# Patient Record
Sex: Male | Born: 1955
Health system: Southern US, Community
[De-identification: ages and names within clinical notes are randomized; demographics above are authoritative.]

## PROBLEM LIST (undated history)

## (undated) DIAGNOSIS — R7303 Prediabetes: Secondary | ICD-10-CM

## (undated) DIAGNOSIS — G4730 Sleep apnea, unspecified: Secondary | ICD-10-CM

## (undated) HISTORY — PX: TONSILLECTOMY: SUR1361

## (undated) HISTORY — DX: Prediabetes: R73.03

## (undated) HISTORY — DX: Sleep apnea, unspecified: G47.30

## (undated) HISTORY — PX: HERNIA REPAIR: SHX51

---

## 1999-05-12 ENCOUNTER — Ambulatory Visit (HOSPITAL_COMMUNITY): Admission: RE | Admit: 1999-05-12 | Discharge: 1999-05-12 | Payer: Self-pay | Admitting: Gastroenterology

## 2000-04-05 ENCOUNTER — Encounter: Payer: Self-pay | Admitting: Neurosurgery

## 2000-04-05 ENCOUNTER — Ambulatory Visit (HOSPITAL_COMMUNITY): Admission: RE | Admit: 2000-04-05 | Discharge: 2000-04-05 | Payer: Self-pay | Admitting: Neurosurgery

## 2000-04-26 ENCOUNTER — Ambulatory Visit (HOSPITAL_COMMUNITY): Admission: RE | Admit: 2000-04-26 | Discharge: 2000-04-26 | Payer: Self-pay | Admitting: Neurosurgery

## 2000-04-26 ENCOUNTER — Encounter: Payer: Self-pay | Admitting: Neurosurgery

## 2001-01-18 ENCOUNTER — Encounter: Payer: Self-pay | Admitting: Neurological Surgery

## 2001-01-18 ENCOUNTER — Observation Stay (HOSPITAL_COMMUNITY): Admission: RE | Admit: 2001-01-18 | Discharge: 2001-01-19 | Payer: Self-pay | Admitting: Neurological Surgery

## 2009-07-02 ENCOUNTER — Ambulatory Visit: Payer: Self-pay | Admitting: Family Medicine

## 2010-07-15 ENCOUNTER — Ambulatory Visit: Payer: Self-pay | Admitting: Internal Medicine

## 2010-07-24 ENCOUNTER — Ambulatory Visit: Payer: Self-pay | Admitting: Internal Medicine

## 2010-12-12 ENCOUNTER — Ambulatory Visit: Payer: Self-pay | Admitting: Internal Medicine

## 2014-05-25 DIAGNOSIS — M545 Low back pain, unspecified: Secondary | ICD-10-CM | POA: Insufficient documentation

## 2014-10-23 ENCOUNTER — Other Ambulatory Visit: Payer: Self-pay | Admitting: Gastroenterology

## 2014-10-23 LAB — HM COLONOSCOPY

## 2015-04-26 ENCOUNTER — Encounter: Payer: Self-pay | Admitting: Podiatry

## 2015-04-26 ENCOUNTER — Ambulatory Visit (INDEPENDENT_AMBULATORY_CARE_PROVIDER_SITE_OTHER): Payer: 59 | Admitting: Podiatry

## 2015-04-26 VITALS — BP 113/67 | HR 72 | Resp 16 | Ht 70.0 in | Wt 185.0 lb

## 2015-04-26 DIAGNOSIS — L600 Ingrowing nail: Secondary | ICD-10-CM

## 2015-04-26 MED ORDER — NEOMYCIN-POLYMYXIN-HC 3.5-10000-1 OT SOLN: OTIC | Status: DC

## 2015-04-26 NOTE — Progress Notes (Signed)
He presents today with chief complaint of a painful ingrown toenail to the tibial border hallux left. He states that is the like this on and off for many years he usually has to take the corners out. He denies any changes in his past medical history medications or allergies.  Objective: Vital signs are stable he's alert and oriented 3. Pulses are strongly palpable bilateral. Sharp rated now margins with erythema and a paronychia to the tibial border of the hallux left tenderness along the fibular border as well.  Assessment: Ingrown nail paronychia Says hallux left.  Plan: Chemical matrixectomy tibial border hallux left was performed after local anesthesia was administered he tolerated the procedure well. He was given home-going oral and written instructions for the care and soaking of his toe. I will follow-up with him in 1 week.

## 2015-04-26 NOTE — Patient Instructions (Signed)

## 2015-05-06 ENCOUNTER — Ambulatory Visit: Payer: 59 | Admitting: Podiatry

## 2015-12-11 DIAGNOSIS — R7303 Prediabetes: Secondary | ICD-10-CM | POA: Insufficient documentation

## 2017-03-03 DIAGNOSIS — Z7189 Other specified counseling: Secondary | ICD-10-CM | POA: Insufficient documentation

## 2017-03-03 DIAGNOSIS — Z7185 Encounter for immunization safety counseling: Secondary | ICD-10-CM | POA: Insufficient documentation

## 2018-01-14 ENCOUNTER — Encounter: Payer: Self-pay | Admitting: Physician Assistant

## 2018-01-14 ENCOUNTER — Other Ambulatory Visit: Payer: Self-pay

## 2018-01-14 ENCOUNTER — Ambulatory Visit (INDEPENDENT_AMBULATORY_CARE_PROVIDER_SITE_OTHER): Payer: 59 | Admitting: Physician Assistant

## 2018-01-14 VITALS — BP 140/80 | HR 78 | Resp 16 | Ht 70.0 in | Wt 197.4 lb

## 2018-01-14 DIAGNOSIS — R0609 Other forms of dyspnea: Secondary | ICD-10-CM | POA: Diagnosis not present

## 2018-01-14 DIAGNOSIS — R079 Chest pain, unspecified: Secondary | ICD-10-CM | POA: Diagnosis not present

## 2018-01-14 DIAGNOSIS — R030 Elevated blood-pressure reading, without diagnosis of hypertension: Secondary | ICD-10-CM | POA: Diagnosis not present

## 2018-01-14 DIAGNOSIS — Z7689 Persons encountering health services in other specified circumstances: Secondary | ICD-10-CM | POA: Diagnosis not present

## 2018-01-14 NOTE — Patient Instructions (Signed)

## 2018-01-14 NOTE — Progress Notes (Signed)
Name: Erik [name]   MRN: [id number]    DOB: November 06, 1956   Date:01/14/2018       Progress Note  Subjective  Chief Complaint  Chief Complaint  Patient presents with  . Establish Care    HPI ARIUS [name] is a 62 y.o male coming in to establish care as a new patient. Patient reports he has been having some sharp pain off and on for the past week. Reports that today is just a dull pain. Feels very tired,some SOB and dizzy spell 2 weeks ago associated with the chest heaviness. No family history of cardiovascular disease that he is aware of, except father had to have a pacemaker unsure why.  No problem-specific Assessment & Plan notes found for this encounter.   History reviewed. No pertinent past medical history.  Past Surgical History:  Procedure Laterality Date  . HERNIA REPAIR      Family History  Problem Relation Age of Onset  . Cancer Mother        Breast  . Cancer Father        Esophagus    Social History   Socioeconomic History  . Marital status: Married    Spouse name: Not on file  . Number of children: Not on file  . Years of education: Not on file  . Highest education level: Not on file  Social Needs  . Financial resource strain: Not on file  . Food insecurity - worry: Not on file  . Food insecurity - inability: Not on file  . Transportation needs - medical: Not on file  . Transportation needs - non-medical: Not on file  Occupational History  . Not on file  Tobacco Use  . Smoking status: Never Smoker  . Smokeless tobacco: Never Used  Substance and Sexual Activity  . Alcohol use: Yes    Alcohol/week: 0.0 oz    Comment: 1-2 or more a month  . Drug use: Not on file  . Sexual activity: Not on file  Other Topics Concern  . Not on file  Social History Narrative  . Not on file    No [name] outpatient medications on file.  No Known Allergies   Review of Systems  Constitutional: Negative.        Activity change  HENT: Negative.   Eyes: Negative.    Respiratory: Negative.        Chest tightness  Cardiovascular: Positive for chest pain.  Gastrointestinal: Negative.   Genitourinary: Negative.   Musculoskeletal: Negative.   Skin: Negative.   Neurological: Negative.   Endo/Heme/Allergies: Positive for environmental allergies.  Psychiatric/Behavioral: Negative.     Objective  Vitals:   01/14/18 1510  BP: 140/80  Pulse: 78  Resp: 16  SpO2: 98%  Weight: 197 lb 6.4 oz (89.5 kg)  Height: 5\' 10"  (1.778 m)    Physical Exam  Constitutional: He is well-developed, well-nourished, and in no distress. No distress.  HENT:  Head: Normocephalic and atraumatic.  Right Ear: Hearing, tympanic membrane, external ear and ear canal normal.  Left Ear: Hearing, tympanic membrane, external ear and ear canal normal.  Nose: Nose normal.  Mouth/Throat: Uvula is midline, oropharynx is clear and moist and mucous membranes are normal. No oropharyngeal exudate, posterior oropharyngeal edema or posterior oropharyngeal erythema.  Eyes: Conjunctivae and EOM are normal. Pupils are equal, round, and reactive to light. Right eye exhibits no discharge. Left eye exhibits no discharge.  Neck: Normal range of motion. Neck supple. No JVD present. Carotid bruit  is not present. No tracheal deviation present. No thyromegaly present.  Cardiovascular: Normal rate, regular rhythm, normal heart sounds and intact distal pulses.  No murmur heard. Pulmonary/Chest: Effort normal and breath sounds normal. No respiratory distress. He has no wheezes. He exhibits no tenderness.  Abdominal: Soft. Bowel sounds are normal. He exhibits no distension. There is no tenderness. A hernia is present. Hernia confirmed positive in the umbilical area.  Lymphadenopathy:    He has no cervical adenopathy.  Vitals reviewed.    No results found for this or any previous visit (from the past 2160 hour(s)).   Assessment & Plan  Problem List Items Addressed This Visit    None    Visit  Diagnoses    Chest pain, unspecified type    -  Primary   Relevant Orders   EKG 12-Lead     1. Establishing care with new doctor, encounter for Previously a patient of Dr. Randa LynnLamb, records in Epic.   2. Chest pain, unspecified type Symptoms are consistent with angina. EKG today showed NSR rate of 64. No ST changes noted. Will check labs as below. I will f/u pending results. Referral placed to Dr. Kirke CorinArida as below, patient preference.May start ASA 81mg  for time being.  I will see him back in 6 weeks for CPE.  - EKG 12-Lead - Ambulatory referral to Cardiology - CBC w/Diff/Platelet - Comprehensive Metabolic Panel (CMET) - TSH  3. Elevated blood pressure reading Slightly elevated today in the office. Will monitor.  - Ambulatory referral to Cardiology - CBC w/Diff/Platelet - Comprehensive Metabolic Panel (CMET) - TSH  4. DOE (dyspnea on exertion) See above medical treatment plan. - Ambulatory referral to Cardiology - CBC w/Diff/Platelet - Comprehensive Metabolic Panel (CMET) - TSH

## 2018-01-15 LAB — COMPREHENSIVE METABOLIC PANEL
ALT: 32 IU/L (ref 0–44)
AST: 23 IU/L (ref 0–40)
Albumin/Globulin Ratio: 1.3 (ref 1.2–2.2)
Albumin: 4.1 g/dL (ref 3.5–5.5)
Alkaline Phosphatase: 59 IU/L (ref 39–117)
BUN/Creatinine Ratio: 21 — ABNORMAL HIGH (ref 9–20)
BUN: 21 mg/dL (ref 6–24)
Bilirubin Total: <0.2 mg/dL (ref 0.0–1.2)
CO2: 21 mmol/L (ref 20–29)
Calcium: 9.2 mg/dL (ref 8.7–10.2)
Chloride: 106 mmol/L (ref 96–106)
Creatinine, Ser: 1 mg/dL (ref 0.76–1.27)
GFR calc Af Amer: 95 mL/min/{1.73_m2} (ref >59)
GFR calc non Af Amer: 82 mL/min/{1.73_m2} (ref >59)
Globulin, Total: 3.2 g/dL (ref 1.5–4.5)
Glucose: 88 mg/dL (ref 65–99)
Potassium: 4 mmol/L (ref 3.5–5.2)
Sodium: 140 mmol/L (ref 134–144)
Total Protein: 7.3 g/dL (ref 6.0–8.5)

## 2018-01-15 LAB — CBC WITH DIFFERENTIAL/PLATELET
Basophils Absolute: 0.1 10*3/uL (ref 0.0–0.2)
Basos: 1 %
EOS (ABSOLUTE): 0.1 10*3/uL (ref 0.0–0.4)
Eos: 2 %
Hematocrit: 41 % (ref 37.5–51.0)
Hemoglobin: 14.1 g/dL (ref 13.0–17.7)
Immature Grans (Abs): 0 10*3/uL (ref 0.0–0.1)
Immature Granulocytes: 0 %
Lymphocytes Absolute: 1.4 10*3/uL (ref 0.7–3.1)
Lymphs: 19 %
MCH: 30.6 pg (ref 26.6–33.0)
MCHC: 34.4 g/dL (ref 31.5–35.7)
MCV: 89 fL (ref 79–97)
Monocytes Absolute: 0.6 10*3/uL (ref 0.1–0.9)
Monocytes: 8 %
Neutrophils Absolute: 5.2 10*3/uL (ref 1.4–7.0)
Neutrophils: 70 %
Platelets: 226 10*3/uL (ref 150–379)
RBC: 4.61 x10E6/uL (ref 4.14–5.80)
RDW: 13.6 % (ref 12.3–15.4)
WBC: 7.4 10*3/uL (ref 3.4–10.8)

## 2018-01-15 LAB — TSH: TSH: 2.61 u[IU]/mL (ref 0.450–4.500)

## 2018-01-17 ENCOUNTER — Telehealth: Payer: Self-pay

## 2018-01-17 NOTE — Telephone Encounter (Signed)
LMTCB  Thanks,  -Joseline 

## 2018-01-17 NOTE — Telephone Encounter (Signed)
-----   Message from Margaretann LovelessJennifer M Burnette, [place]PA-C sent at 01/17/2018  1:14 PM EST ----- All labs are within normal limits and stable.  Thanks! -JB

## 2018-01-17 NOTE — Telephone Encounter (Signed)
Patient advised as directed below.  Thanks,  -Marquay Kruse 

## 2018-01-25 ENCOUNTER — Encounter: Payer: Self-pay | Admitting: Cardiovascular Disease

## 2018-01-25 ENCOUNTER — Ambulatory Visit (INDEPENDENT_AMBULATORY_CARE_PROVIDER_SITE_OTHER): Payer: 59 | Admitting: Cardiovascular Disease

## 2018-01-25 VITALS — BP 136/70 | Ht 70.0 in | Wt 198.2 lb

## 2018-01-25 DIAGNOSIS — R0600 Dyspnea, unspecified: Secondary | ICD-10-CM

## 2018-01-25 DIAGNOSIS — R079 Chest pain, unspecified: Secondary | ICD-10-CM | POA: Diagnosis not present

## 2018-01-25 NOTE — Patient Instructions (Addendum)
Medication Instructions:  Your physician recommends that you continue on your current medications as directed. Please refer to the Current Medication list given to you today.   Labwork: none  Testing/Procedures: Your physician has requested that you have en exercise stress myoview. For further information please visit [email or web address] Please follow instruction sheet, as given.  [facility] MYOVIEW  Your caregiver has ordered a Stress Test with nuclear imaging. The purpose of this test is to evaluate the blood supply to your heart muscle. This procedure is referred to as a "Non-Invasive Stress Test." This is because other than having an IV started in your vein, nothing is inserted or "invades" your body. Cardiac stress tests are done to find areas of poor blood flow to the heart by determining the extent of coronary artery disease (CAD). Some patients exercise on a treadmill, which naturally increases the blood flow to your heart, while others who are  unable to walk on a treadmill due to physical limitations have a pharmacologic/chemical stress agent called Lexiscan . This medicine will mimic walking on a treadmill by temporarily increasing your coronary blood flow.   Please note: these test may take anywhere between 2-4 hours to complete  PLEASE REPORT TO [facility] MEDICAL MALL ENTRANCE  THE VOLUNTEERS AT THE FIRST DESK WILL DIRECT YOU WHERE TO GO  Date of Procedure:_____________________________________  Arrival Time for Procedure:______________________________    PLEASE NOTIFY THE OFFICE AT LEAST 24 HOURS IN ADVANCE IF YOU ARE UNABLE TO KEEP YOUR APPOINTMENT.  [phone] AND  PLEASE NOTIFY NUCLEAR MEDICINE AT [facility] AT LEAST 24 HOURS IN ADVANCE IF YOU ARE UNABLE TO KEEP YOUR APPOINTMENT. [phone]  How to prepare for your Myoview test:  1. Do not eat or drink after midnight 2. No caffeine for 24 hours prior to test 3. No smoking 24 hours prior to test. 4. Your medication may be taken  with water.  If your doctor stopped a medication because of this test, do not take that medication. 5. Ladies, please do not wear dresses.  Skirts or pants are appropriate. Please wear a short sleeve shirt. 6. No perfume, cologne or lotion. 7. Wear comfortable walking shoes. No heels!            Follow-Up: Your physician recommends that you schedule a follow-up appointment as needed.    Any Other Special Instructions Will Be Listed Below (If Applicable).     If you need a refill on your cardiac medications before your next appointment, please call your pharmacy.  Cardiac Nuclear Scan A cardiac nuclear scan is a test that measures blood flow to the heart when a person is resting and when he or she is exercising. The test looks for problems such as:  Not enough blood reaching a portion of the heart.  The heart muscle not working normally.  You may need this test if:  You have heart disease.  You have had abnormal lab results.  You have had heart surgery or angioplasty.  You have chest pain.  You have shortness of breath.  In this test, a radioactive dye (tracer) is injected into your bloodstream. After the tracer has traveled to your heart, an imaging device is used to measure how much of the tracer is absorbed by or distributed to various areas of your heart. This procedure is usually done at a hospital and takes 2-4 hours. Tell a health care provider about:  Any allergies you have.  All medicines you are taking, including vitamins, herbs, eye drops, creams, and  over-the-counter medicines.  Any problems you or family members have had with the use of anesthetic medicines.  Any blood disorders you have.  Any surgeries you have had.  Any medical conditions you have.  Whether you are pregnant or may be pregnant. What are the risks? Generally, this is a safe procedure. However, problems may occur, including:  Serious chest pain and heart attack. This is only a  risk if the stress portion of the test is done.  Rapid heartbeat.  Sensation of warmth in your chest. This usually passes quickly.  What happens before the procedure?  Ask your health care provider about changing or stopping your regular medicines. This is especially important if you are taking diabetes medicines or blood thinners.  Remove your jewelry on the day of the procedure. What happens during the procedure?  An IV tube will be inserted into one of your veins.  Your health care provider will inject a small amount of radioactive tracer through the tube.  You will wait for 20-40 minutes while the tracer travels through your bloodstream.  Your heart activity will be monitored with an electrocardiogram (ECG).  You will lie down on an exam table.  Images of your heart will be taken for about 15-20 minutes.  You may be asked to exercise on a treadmill or stationary bike. While you exercise, your heart's activity will be monitored with an ECG, and your blood pressure will be checked. If you are unable to exercise, you may be given a medicine to increase blood flow to parts of your heart.  When blood flow to your heart has peaked, a tracer will again be injected through the IV tube.  After 20-40 minutes, you will get back on the exam table and have more images taken of your heart.  When the procedure is over, your IV tube will be removed. The procedure may vary among health care providers and hospitals. Depending on the type of tracer used, scans may need to be repeated 3-4 hours later. What happens after the procedure?  Unless your health care provider tells you otherwise, you may return to your normal schedule, including diet, activities, and medicines.  Unless your health care provider tells you otherwise, you may increase your fluid intake. This will help flush the contrast dye from your body. Drink enough fluid to keep your urine clear or pale yellow.  It is up to you to  get your test results. Ask your health care provider, or the department that is doing the test, when your results will be ready. Summary  A cardiac nuclear scan measures the blood flow to the heart when a person is resting and when he or she is exercising.  You may need this test if you are at risk for heart disease.  Tell your health care provider if you are pregnant.  Unless your health care provider tells you otherwise, increase your fluid intake. This will help flush the contrast dye from your body. Drink enough fluid to keep your urine clear or pale yellow. This information is not intended to replace advice given to you by your health care provider. Make sure you discuss any questions you have with your health care provider. Document Released: 11/27/2004 Document Revised: 11/04/2016 Document Reviewed: 10/11/2013 Elsevier Interactive Patient Education  2017 [employer]

## 2018-01-25 NOTE — Progress Notes (Signed)
Cardiology Office Note   Date:  01/25/2018   ID:  PASQUAL [name], DOB 05-13-1956, MRN [id number]  PCP:  Margaretann Loveless, PA-C  Cardiologist:   Lorine Bears, MD   Chief Complaint  Patient presents with  . other    Ref by Dr. Rosezetta Schlatter for chest pain. Meds reviewed by the pt. verbally. Pt. c/o a lot of indigestion and chest pain that comes and goes with occas. shortness of breath with over exertion. Pt. c/o a dull ache in chest all day today.       History of Present Illness: Erik [name] is a 62 y.o. male who was referred by Joycelyn Man for evaluation of chest pain.  He has no previous cardiac history and no significant chronic medical conditions other than mild obesity.  He reports gaining 12 pounds since Christmas. He is not a smoker and there is no family history of premature coronary artery disease.  Over the last few weeks, he experienced intermittent episodes of chest pain described as sharp discomfort lasting for a few minutes substernally and on the left side with no radiation to his shoulder, neck or jaw.  The pain was mostly at rest.  He also has occasional dull aching sensation with indigestion also at rest.  He tried to start an exercise program walking on trails but he noticed that he was getting tired quicker with shortness of breath.  He has been under stress lately .    History reviewed. No pertinent past medical history.  Past Surgical History:  Procedure Laterality Date  . HERNIA REPAIR    . TONSILLECTOMY       No current outpatient medications on file.   No current facility-administered medications for this visit.     Allergies:   Patient has no known allergies.    Social History:  The patient  reports that  has never smoked. he has never used smokeless tobacco. He reports that he drinks alcohol.   Family History:  The patient's family history includes Cancer in his father and mother; Hypertension in his brother and sister; Supraventricular  tachycardia in his sister.    ROS:  Please see the history of present illness.   Otherwise, review of systems are positive for none.   All other systems are reviewed and negative.    PHYSICAL EXAM: VS:  BP 136/70 (BP Location: Right Arm, Patient Position: Sitting, Cuff Size: Normal)   Ht 5\' 10"  (1.778 m)   Wt 198 lb 4 oz (89.9 kg)   BMI 28.45 kg/m  , BMI Body mass index is 28.45 kg/m. GEN: Well nourished, well developed, in no acute distress  HEENT: normal  Neck: no JVD, carotid bruits, or masses Cardiac: RRR; no murmurs, rubs, or gallops,no edema  Respiratory:  clear to auscultation bilaterally, normal work of breathing GI: soft, nontender, nondistended, + BS MS: no deformity or atrophy  Skin: warm and dry, no rash Neuro:  Strength and sensation are intact Psych: euthymic mood, full affect   EKG:  EKG is ordered today. The ekg ordered today demonstrates normal sinus rhythm with no significant ST or T wave changes.   Recent Labs: 01/14/2018: ALT 32; BUN 21; Creatinine, Ser 1.00; Hemoglobin 14.1; Platelets 226; Potassium 4.0; Sodium 140; TSH 2.610    Lipid Panel No results found for: CHOL, TRIG, HDL, CHOLHDL, VLDL, LDLCALC, LDLDIRECT    Wt Readings from Last 3 Encounters:  01/25/18 198 lb 4 oz (89.9 kg)  01/14/18 197 lb 6.4  oz (89.5 kg)  04/26/15 185 lb (83.9 kg)       PAD Screen 01/25/2018  Previous PAD dx? No  Previous surgical procedure? No  Pain with walking? No  Feet/toe relief with dangling? No  Painful, non-healing ulcers? No  Extremities discolored? No      ASSESSMENT AND PLAN:  1.  Atypical chest pain: His chest pain is overall atypical and could be musculoskeletal.  However, he has noticed significant worsening of exertional dyspnea and fatigue over the last few weeks.  Due to that, I recommend evaluation with a treadmill nuclear stress test. If stress test comes back unremarkable, I advised him to start an exercise program.  2.  Exertional dyspnea:  No evidence of heart failure and cardiac exam is unremarkable.  We have to exclude angina equivalent as outlined above.    Disposition:   FU with me as needed.   Signed,  Lorine BearsMuhammad Addis Tuohy, MD  01/25/2018 4:00 PM    [facility]

## 2018-01-28 ENCOUNTER — Encounter
Admission: RE | Admit: 2018-01-28 | Discharge: 2018-01-28 | Disposition: A | Discharge status: Home / Self Care | Payer: 59 | Source: Ambulatory Visit | Attending: Cardiovascular Disease | Admitting: Cardiovascular Disease

## 2018-01-28 DIAGNOSIS — R079 Chest pain, unspecified: Secondary | ICD-10-CM | POA: Diagnosis not present

## 2018-01-28 LAB — NM MYOCAR MULTI W/SPECT W/WALL MOTION / EF
CHL CUP RESTING HR STRESS: 58 {beats}/min
CSEPEDS: 39 s
CSEPHR: 90 %
CSEPPHR: 146 {beats}/min
Estimated workload: 7 METS
Exercise duration (min): 5 min
LV sys vol: 31 mL
LVDIAVOL: 85 mL (ref 62–150)
SDS: 0
SRS: 1
SSS: 0
TID: 1.07

## 2018-01-28 MED ORDER — TECHNETIUM TC 99M TETROFOSMIN IV KIT
10.0000 | PACK | Freq: Once | INTRAVENOUS | Status: AC | PRN
  Administered 2018-01-28: 13.13 via INTRAVENOUS

## 2018-01-28 MED ORDER — TECHNETIUM TC 99M TETROFOSMIN IV KIT
30.0000 | PACK | Freq: Once | INTRAVENOUS | Status: AC | PRN
  Administered 2018-01-28: 31.26 via INTRAVENOUS

## 2018-02-01 ENCOUNTER — Other Ambulatory Visit: Payer: Self-pay

## 2018-02-02 ENCOUNTER — Other Ambulatory Visit: Payer: Self-pay | Admitting: *Deleted

## 2018-02-02 DIAGNOSIS — R0600 Dyspnea, unspecified: Secondary | ICD-10-CM

## 2018-02-22 ENCOUNTER — Ambulatory Visit (INDEPENDENT_AMBULATORY_CARE_PROVIDER_SITE_OTHER): Payer: 59

## 2018-02-22 ENCOUNTER — Other Ambulatory Visit: Payer: Self-pay

## 2018-02-22 DIAGNOSIS — R0600 Dyspnea, unspecified: Secondary | ICD-10-CM

## 2018-02-25 ENCOUNTER — Ambulatory Visit (INDEPENDENT_AMBULATORY_CARE_PROVIDER_SITE_OTHER): Payer: 59 | Admitting: Physician Assistant

## 2018-02-25 ENCOUNTER — Encounter: Payer: Self-pay | Admitting: Physician Assistant

## 2018-02-25 VITALS — BP 140/88 | HR 83 | Temp 98.4°F | Resp 16 | Ht 70.0 in | Wt 198.0 lb

## 2018-02-25 DIAGNOSIS — R059 Cough, unspecified: Secondary | ICD-10-CM

## 2018-02-25 DIAGNOSIS — Z1159 Encounter for screening for other viral diseases: Secondary | ICD-10-CM

## 2018-02-25 DIAGNOSIS — Z114 Encounter for screening for human immunodeficiency virus [HIV]: Secondary | ICD-10-CM | POA: Diagnosis not present

## 2018-02-25 DIAGNOSIS — Z6828 Body mass index (BMI) 28.0-28.9, adult: Secondary | ICD-10-CM | POA: Diagnosis not present

## 2018-02-25 DIAGNOSIS — R7989 Other specified abnormal findings of blood chemistry: Secondary | ICD-10-CM | POA: Diagnosis not present

## 2018-02-25 DIAGNOSIS — R7303 Prediabetes: Secondary | ICD-10-CM | POA: Diagnosis not present

## 2018-02-25 DIAGNOSIS — Z0000 Encounter for general adult medical examination without abnormal findings: Secondary | ICD-10-CM

## 2018-02-25 DIAGNOSIS — Z125 Encounter for screening for malignant neoplasm of prostate: Secondary | ICD-10-CM

## 2018-02-25 DIAGNOSIS — R05 Cough: Secondary | ICD-10-CM

## 2018-02-25 MED ORDER — PSEUDOEPH-BROMPHEN-DM 30-2-10 MG/5ML PO SYRP: 5.0000 mL | ORAL_SOLUTION | Freq: Three times a day (TID) | ORAL | 0 refills | Status: DC | PRN

## 2018-02-25 NOTE — Progress Notes (Signed)
Patient: Erik [name], Male    DOB: 04-04-1956, 62 y.o.   MRN: [id number] Visit Date: 02/25/2018  Today's Provider: Margaretann Loveless, PA-C   Chief Complaint  Patient presents with  . Annual Exam   Subjective:    Annual physical exam Erik [name] is a 62 y.o. male who presents today for health maintenance and complete physical. He feels well. He reports exercising none. He reports he is sleeping fairly well. Reports that his echo and stress test came back Normal. ----------------------------------------------------------------- Per patient received Td/Tdap: in 2011 Colonoscopy:10/23/14 per care everywhere. Reports he had it done at [facility] gastro in [place] with Dr.Buccini.  Patient also c/o cough.  Cough is dry, started on Saturday.  Reports is worse at night that he wakes up with feeling like he is choking.    Review of Systems  Constitutional: Positive for fatigue.  HENT: Positive for congestion.   Eyes: Negative.   Respiratory: Positive for cough.   Cardiovascular: Positive for chest pain.  Gastrointestinal: Negative.   Endocrine: Negative.   Genitourinary: Negative.   Musculoskeletal: Negative.   Skin: Negative.   Allergic/Immunologic: Positive for environmental allergies.  Neurological: Negative.   Hematological: Negative.   Psychiatric/Behavioral: Positive for decreased concentration.    Social History      He  reports that he has never smoked. He has never used smokeless tobacco. He reports that he drinks alcohol.       Social History   Socioeconomic History  . Marital status: Married    Spouse name: Not on file  . Number of children: Not on file  . Years of education: Not on file  . Highest education level: Not on file  Occupational History  . Not on file  Social Needs  . Financial resource strain: Not on file  . Food insecurity:    Worry: Not on file    Inability: Not on file  . Transportation needs:    Medical: Not on file   Non-medical: Not on file  Tobacco Use  . Smoking status: Never Smoker  . Smokeless tobacco: Never Used  Substance and Sexual Activity  . Alcohol use: Yes    Alcohol/week: 0.0 oz    Comment: 1-2 or more a month  . Drug use: Not on file  . Sexual activity: Not on file  Lifestyle  . Physical activity:    Days per week: Not on file    Minutes per session: Not on file  . Stress: Not on file  Relationships  . Social connections:    Talks on phone: Not on file    Gets together: Not on file    Attends religious service: Not on file    Active member of club or organization: Not on file    Attends meetings of clubs or organizations: Not on file    Relationship status: Not on file  Other Topics Concern  . Not on file  Social History Narrative  . Not on file    History reviewed. No pertinent past medical history.   Patient Active Problem List   Diagnosis Date Noted  . Low serum testosterone 02/25/2018  . Borderline diabetes 12/11/2015    Past Surgical History:  Procedure Laterality Date  . HERNIA REPAIR    . TONSILLECTOMY      Family History        Family Status  Relation Name Status  . Mother  Deceased  . Father  Alive  . Sister  Alive  . Brother  Alive        His family history includes Cancer in his father and mother; Hypertension in his brother and sister; Supraventricular tachycardia in his sister.      No Known Allergies  No current outpatient medications on file.   Patient Care Team: Margaretann Loveless, PA-C as PCP - General (Family Medicine)      Objective:   Vitals: BP 140/88 (BP Location: Left Arm, Patient Position: Sitting, Cuff Size: Normal)   Pulse 83   Temp 98.4 F (36.9 C) (Oral)   Resp 16   Ht 5\' 10"  (1.778 m)   Wt 198 lb (89.8 kg)   SpO2 97% Comment: 97-96%  BMI 28.41 kg/m     Physical Exam  Constitutional: He is oriented to person, place, and time. He appears well-developed and well-nourished.  HENT:  Head: Normocephalic.    Right Ear: External ear normal.  Left Ear: External ear normal.  Nose: Nose normal.  Mouth/Throat: Oropharynx is clear and moist.  Eyes: Pupils are equal, round, and reactive to light. Conjunctivae and EOM are normal.  Neck: Normal range of motion. Neck supple.  Cardiovascular: Normal rate, regular rhythm, normal heart sounds and intact distal pulses.  No murmur heard. Pulmonary/Chest: Effort normal and breath sounds normal. No respiratory distress. He has no wheezes.  Abdominal: Soft. Bowel sounds are normal. There is no tenderness. There is no guarding.  Genitourinary:  Genitourinary Comments: Deferred per patient  Musculoskeletal: Normal range of motion.  Neurological: He is alert and oriented to person, place, and time.  Skin: Skin is warm and dry.  Psychiatric: He has a normal mood and affect. His behavior is normal. Judgment and thought content normal.  Vitals reviewed.    Depression Screen PHQ 2/9 Scores 02/25/2018 01/14/2018  PHQ - 2 Score 2 2  PHQ- 9 Score 7 5      Assessment & Plan:     Routine Health Maintenance and Physical Exam  Exercise Activities and Dietary recommendations Goals    None       There is no immunization history on file for this patient.  Health Maintenance  Topic Date Due  . Hepatitis C Screening  1956-08-19  . HIV Screening  02/09/1973  . TETANUS/TDAP  02/09/1977  . COLONOSCOPY  02/10/2008  . INFLUENZA VACCINE  06/16/2018     Discussed health benefits of physical activity, and encouraged him to engage in regular exercise appropriate for his age and condition.    1. Annual physical exam Normal physical exam today. Will check labs as below and f/u pending lab results. If labs are stable and WNL he will not need to have these rechecked for one year at his next annual physical exam. He is to call the office in the meantime if he has any acute issue, questions or concerns. - Hemoglobin A1c - Lipid panel  2. Borderline diabetes Diet  controlled. Will check labs as below and f/u pending results. - Hemoglobin A1c - Lipid panel  3. Prostate cancer screening  - PSA  4. BMI 28.0-28.9,adult Counseled patient on healthy lifestyle modifications including dieting and exercise.  - Hemoglobin A1c - Lipid panel  5. Need for hepatitis C screening test  - Hepatitis C antibody  6. Screening for HIV without presence of risk factors  - HIV antibody  7. Cough Add flonase daily. Continue Mucinex. Add Bromfed DM. Push fluids. May add claritin by Monday if no improvement. If no improvement or symptoms worsen  he is to call the office.  - brompheniramine-pseudoephedrine-DM 30-2-10 MG/5ML syrup; Take 5 mLs by mouth 3 (three) times daily as needed.  Dispense: 120 mL; Refill: 0  8. Low serum testosterone Not checked in 2-3 years. Will check labs as below.  - Testosterone  --------------------------------------------------------------------    Margaretann LovelessJennifer M Edla Para, PA-C  [facility]

## 2018-02-25 NOTE — Patient Instructions (Signed)

## 2018-03-03 ENCOUNTER — Telehealth: Payer: Self-pay | Admitting: *Deleted

## 2018-03-03 LAB — HEMOGLOBIN A1C
Est. average glucose Bld gHb Est-mCnc: 117 mg/dL
HEMOGLOBIN A1C: 5.7 % — AB (ref 4.8–5.6)

## 2018-03-03 LAB — TESTOSTERONE: TESTOSTERONE: 283 ng/dL (ref 264–916)

## 2018-03-03 LAB — LIPID PANEL
CHOL/HDL RATIO: 3.3 ratio (ref 0.0–5.0)
Cholesterol, Total: 164 mg/dL (ref 100–199)
HDL: 50 mg/dL (ref >39)
LDL CALC: 92 mg/dL (ref 0–99)
Triglycerides: 110 mg/dL (ref 0–149)
VLDL Cholesterol Cal: 22 mg/dL (ref 5–40)

## 2018-03-03 LAB — HEPATITIS C ANTIBODY

## 2018-03-03 LAB — PSA: PROSTATE SPECIFIC AG, SERUM: 1.3 ng/mL (ref 0.0–4.0)

## 2018-03-03 LAB — HIV ANTIBODY (ROUTINE TESTING W REFLEX): HIV Screen 4th Generation wRfx: NONREACTIVE

## 2018-03-03 NOTE — Telephone Encounter (Signed)
LMOVM for pt to return call 

## 2018-03-03 NOTE — Telephone Encounter (Signed)
-----   Message from Margaretann LovelessJennifer M Burnette, PA-C sent at 03/03/2018 10:05 AM EDT ----- Hemoglobin A1c is borderline elevated of 5.7. Cholesterol is normal. Hep C negative. HIV negative. PSA normal. Testosterone is normal range but is very low normal. Natural testosterone can be boosted without treatments with this finding by healthy dieting and exercise, limit fatty foods and alcohol as these will lower testosterone.

## 2018-03-07 NOTE — Telephone Encounter (Signed)
Left message to call back  

## 2018-03-08 NOTE — Telephone Encounter (Signed)
Patient advised. He verbalized understanding.  

## 2018-05-31 ENCOUNTER — Encounter: Payer: Self-pay | Admitting: Physician Assistant

## 2018-05-31 ENCOUNTER — Ambulatory Visit (INDEPENDENT_AMBULATORY_CARE_PROVIDER_SITE_OTHER): Payer: 59 | Admitting: Physician Assistant

## 2018-05-31 VITALS — BP 108/84 | HR 78 | Temp 98.1°F | Resp 16 | Wt 194.0 lb

## 2018-05-31 DIAGNOSIS — M7542 Impingement syndrome of left shoulder: Secondary | ICD-10-CM | POA: Diagnosis not present

## 2018-05-31 MED ORDER — METHYLPREDNISOLONE 4 MG PO TBPK: ORAL_TABLET | ORAL | 0 refills | Status: DC

## 2018-05-31 NOTE — Patient Instructions (Signed)
Shoulder Impingement Syndrome Shoulder impingement syndrome is a condition that causes pain when connective tissues (tendons) surrounding the shoulder joint become pinched. These tendons are part of the group of muscles and tissues that help to stabilize the shoulder (rotator cuff). Beneath the rotator cuff is a fluid-filled sac (bursa) that allows the muscles and tendons to glide smoothly. The bursa may become swollen or irritated (bursitis). Bursitis, swelling in the rotator cuff tendons, or both conditions can decrease how much space is under a bone in the shoulder joint (acromion), resulting in impingement. What are the causes? Shoulder impingement syndrome can be caused by bursitis or swelling of the rotator cuff tendons, which may result from:  Repetitive overhead arm movements.  Falling onto the shoulder.  Weakness in the shoulder muscles.  What increases the risk? You may be more likely to develop this condition if you are an athlete who participates in:  Sports that involve throwing, such as baseball.  Tennis.  Swimming.  Volleyball.  Some people are also more likely to develop impingement syndrome because of the shape of their acromion bone. What are the signs or symptoms? The main symptom of this condition is pain on the front or side of the shoulder. Pain may:  Get worse when lifting or raising the arm.  Get worse at night.  Wake you up from sleeping.  Feel sharp when the shoulder is moved, and then fade to an ache.  Other signs and symptoms may include:  Tenderness.  Stiffness.  Inability to raise the arm above shoulder level or behind the body.  Weakness.  How is this diagnosed? This condition may be diagnosed based on:  Your symptoms.  Your medical history.  A physical exam.  Imaging tests, such as: ? X-rays. ? MRI. ? Ultrasound.  How is this treated? Treatment for this condition may include:  Resting your shoulder and avoiding all  activities that cause pain or put stress on the shoulder.  Icing your shoulder.  NSAIDs to help reduce pain and swelling.  One or more injections of medicines to numb the area and reduce inflammation.  Physical therapy.  Surgery. This may be needed if nonsurgical treatments have not helped. Surgery may involve repairing the rotator cuff, reshaping the acromion, or removing the bursa.  Follow these instructions at home: Managing pain, stiffness, and swelling  If directed, apply ice to the injured area. ? Put ice in a plastic bag. ? Place a towel between your skin and the bag. ? Leave the ice on for 20 minutes, 2-3 times a day. Activity  Rest and return to your normal activities as told by your health care provider. Ask your health care provider what activities are safe for you.  Do exercises as told by your health care provider. General instructions  Do not use any tobacco products, including cigarettes, chewing tobacco, or e-cigarettes. Tobacco can delay healing. If you need help quitting, ask your health care provider.  Ask your health care provider when it is safe for you to drive.  Take over-the-counter and prescription medicines only as told by your health care provider.  Keep all follow-up visits as told by your health care provider. This is important. How is this prevented?  Give your body time to rest between periods of activity.  Be safe and responsible while being active to avoid falls.  Maintain physical fitness, including strength and flexibility. Contact a health care provider if:  Your symptoms have not improved after 1-2 months of treatment and   rest.  You cannot lift your arm away from your body. This information is not intended to replace advice given to you by your health care provider. Make sure you discuss any questions you have with your health care provider. Document Released: 11/02/2005 Document Revised: 07/09/2016 Document Reviewed:  10/05/2015 Elsevier Interactive Patient Education  2018 [employer]  Shoulder Exercises Ask your health care provider which exercises are safe for you. Do exercises exactly as told by your health care provider and adjust them as directed. It is normal to feel mild stretching, pulling, tightness, or discomfort as you do these exercises, but you should stop right away if you feel sudden pain or your pain gets worse.Do not begin these exercises until told by your health care provider. RANGE OF MOTION EXERCISES These exercises warm up your muscles and joints and improve the movement and flexibility of your shoulder. These exercises also help to relieve pain, numbness, and tingling. These exercises involve stretching your injured shoulder directly. Exercise A: Pendulum  1. Stand near a wall or a surface that you can hold onto for balance. 2. Bend at the waist and let your left / right arm hang straight down. Use your other arm to support you. Keep your back straight and do not lock your knees. 3. Relax your left / right arm and shoulder muscles, and move your hips and your trunk so your left / right arm swings freely. Your arm should swing because of the motion of your body, not because you are using your arm or shoulder muscles. 4. Keep moving your body so your arm swings in the following directions, as told by your health care provider: ? Side to side. ? Forward and backward. ? In clockwise and counterclockwise circles. 5. Continue each motion for __________ seconds, or for as long as told by your health care provider. 6. Slowly return to the starting position. Repeat __________ times. Complete this exercise __________ times a day. Exercise B:Flexion, Standing  1. Stand and hold a broomstick, a cane, or a similar object. Place your hands a little more than shoulder-width apart on the object. Your left / right hand should be palm-up, and your other hand should be palm-down. 2. Keep your elbow  straight and keep your shoulder muscles relaxed. Push the stick down with your healthy arm to raise your left / right arm in front of your body, and then over your head until you feel a stretch in your shoulder. ? Avoid shrugging your shoulder while you raise your arm. Keep your shoulder blade tucked down toward the middle of your back. 3. Hold for __________ seconds. 4. Slowly return to the starting position. Repeat __________ times. Complete this exercise __________ times a day. Exercise C: Abduction, Standing 1. Stand and hold a broomstick, a cane, or a similar object. Place your hands a little more than shoulder-width apart on the object. Your left / right hand should be palm-up, and your other hand should be palm-down. 2. While keeping your elbow straight and your shoulder muscles relaxed, push the stick across your body toward your left / right side. Raise your left / right arm to the side of your body and then over your head until you feel a stretch in your shoulder. ? Do not raise your arm above shoulder height, unless your health care provider tells you to do that. ? Avoid shrugging your shoulder while you raise your arm. Keep your shoulder blade tucked down toward the middle of your back. 3.   Hold for __________ seconds. 4. Slowly return to the starting position. Repeat __________ times. Complete this exercise __________ times a day. Exercise D:Internal Rotation  1. Place your left / right hand behind your back, palm-up. 2. Use your other hand to dangle an exercise band, a towel, or a similar object over your shoulder. Grasp the band with your left / right hand so you are holding onto both ends. 3. Gently pull up on the band until you feel a stretch in the front of your left / right shoulder. ? Avoid shrugging your shoulder while you raise your arm. Keep your shoulder blade tucked down toward the middle of your back. 4. Hold for __________ seconds. 5. Release the stretch by letting go  of the band and lowering your hands. Repeat __________ times. Complete this exercise __________ times a day. STRETCHING EXERCISES These exercises warm up your muscles and joints and improve the movement and flexibility of your shoulder. These exercises also help to relieve pain, numbness, and tingling. These exercises are done using your healthy shoulder to help stretch the muscles of your injured shoulder. Exercise E: Corner Stretch (External Rotation and Abduction)  1. Stand in a doorway with one of your feet slightly in front of the other. This is called a staggered stance. If you cannot reach your forearms to the door frame, stand facing a corner of a room. 2. Choose one of the following positions as told by your health care provider: ? Place your hands and forearms on the door frame above your head. ? Place your hands and forearms on the door frame at the height of your head. ? Place your hands on the door frame at the height of your elbows. 3. Slowly move your weight onto your front foot until you feel a stretch across your chest and in the front of your shoulders. Keep your head and chest upright and keep your abdominal muscles tight. 4. Hold for __________ seconds. 5. To release the stretch, shift your weight to your back foot. Repeat __________ times. Complete this stretch __________ times a day. Exercise F:Extension, Standing 1. Stand and hold a broomstick, a cane, or a similar object behind your back. ? Your hands should be a little wider than shoulder-width apart. ? Your palms should face away from your back. 2. Keeping your elbows straight and keeping your shoulder muscles relaxed, move the stick away from your body until you feel a stretch in your shoulder. ? Avoid shrugging your shoulders while you move the stick. Keep your shoulder blade tucked down toward the middle of your back. 3. Hold for __________ seconds. 4. Slowly return to the starting position. Repeat __________  times. Complete this exercise __________ times a day. STRENGTHENING EXERCISES These exercises build strength and endurance in your shoulder. Endurance is the ability to use your muscles for a long time, even after they get tired. Exercise G:External Rotation  1. Sit in a stable chair without armrests. 2. Secure an exercise band at elbow height on your left / right side. 3. Place a soft object, such as a folded towel or a small pillow, between your left / right upper arm and your body to move your elbow a few inches away (about 10 cm) from your side. 4. Hold the end of the band so it is tight and there is no slack. 5. Keeping your elbow pressed against the soft object, move your left / right forearm out, away from your abdomen. Keep your body steady so   only your forearm moves. 6. Hold for __________ seconds. 7. Slowly return to the starting position. Repeat __________ times. Complete this exercise __________ times a day. Exercise H:Shoulder Abduction  1. Sit in a stable chair without armrests, or stand. 2. Hold a __________ weight in your left / right hand, or hold an exercise band with both hands. 3. Start with your arms straight down and your left / right palm facing in, toward your body. 4. Slowly lift your left / right hand out to your side. Do not lift your hand above shoulder height unless your health care provider tells you that this is safe. ? Keep your arms straight. ? Avoid shrugging your shoulder while you do this movement. Keep your shoulder blade tucked down toward the middle of your back. 5. Hold for __________ seconds. 6. Slowly lower your arm, and return to the starting position. Repeat __________ times. Complete this exercise __________ times a day. Exercise I:Shoulder Extension 1. Sit in a stable chair without armrests, or stand. 2. Secure an exercise band to a stable object in front of you where it is at shoulder height. 3. Hold one end of the exercise band in each  hand. Your palms should face each other. 4. Straighten your elbows and lift your hands up to shoulder height. 5. Step back, away from the secured end of the exercise band, until the band is tight and there is no slack. 6. Squeeze your shoulder blades together as you pull your hands down to the sides of your thighs. Stop when your hands are straight down by your sides. Do not let your hands go behind your body. 7. Hold for __________ seconds. 8. Slowly return to the starting position. Repeat __________ times. Complete this exercise __________ times a day. Exercise J:Standing Shoulder Row 1. Sit in a stable chair without armrests, or stand. 2. Secure an exercise band to a stable object in front of you so it is at waist height. 3. Hold one end of the exercise band in each hand. Your palms should be in a thumbs-up position. 4. Bend each of your elbows to an "L" shape (about 90 degrees) and keep your upper arms at your sides. 5. Step back until the band is tight and there is no slack. 6. Slowly pull your elbows back behind you. 7. Hold for __________ seconds. 8. Slowly return to the starting position. Repeat __________ times. Complete this exercise __________ times a day. Exercise K:Shoulder Press-Ups  1. Sit in a stable chair that has armrests. Sit upright, with your feet flat on the floor. 2. Put your hands on the armrests so your elbows are bent and your fingers are pointing forward. Your hands should be about even with the sides of your body. 3. Push down on the armrests and use your arms to lift yourself off of the chair. Straighten your elbows and lift yourself up as much as you comfortably can. ? Move your shoulder blades down, and avoid letting your shoulders move up toward your ears. ? Keep your feet on the ground. As you get stronger, your feet should support less of your body weight as you lift yourself up. 4. Hold for __________ seconds. 5. Slowly lower yourself back into the  chair. Repeat __________ times. Complete this exercise __________ times a day. Exercise L: Wall Push-Ups  1. Stand so you are facing a stable wall. Your feet should be about one arm-length away from the wall. 2. Lean forward and place your palms on   the wall at shoulder height. 3. Keep your feet flat on the floor as you bend your elbows and lean forward toward the wall. 4. Hold for __________ seconds. 5. Straighten your elbows to push yourself back to the starting position. Repeat __________ times. Complete this exercise __________ times a day. This information is not intended to replace advice given to you by your health care provider. Make sure you discuss any questions you have with your health care provider. Document Released: 09/16/2005 Document Revised: 07/27/2016 Document Reviewed: 07/14/2015 Elsevier Interactive Patient Education  2018 [employer]  

## 2018-05-31 NOTE — Progress Notes (Signed)
Patient: Erik [name] Male    DOB: Apr 15, 1956   62 y.o.   MRN: [id number] Visit Date: 05/31/2018  Today's Provider: Margaretann LovelessJennifer M Nyomie Ehrlich, PA-C   I, Joslyn HyEmily Ratchford, CMA, am acting as scribe for [employer] CorporationJennifer Demari Kropp, PA-C.  Chief Complaint  Patient presents with  . Shoulder Pain   Subjective:    Shoulder Pain   The pain is present in the left shoulder. This is a new problem. Episode onset: x 3 weeks. There has been no history of extremity trauma. The quality of the pain is described as sharp. The pain is moderate. Associated symptoms include a limited range of motion. Pertinent negatives include no fever, inability to bear weight, itching, joint locking, joint swelling, numbness, stiffness or tingling. The symptoms are aggravated by lying down. He has tried NSAIDS for the symptoms. The treatment provided mild relief.  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     No Known Allergies  No current outpatient medications on file.  Review of Systems  Constitutional: Negative for fever.  Respiratory: Negative.   Cardiovascular: Negative.   Musculoskeletal: Positive for arthralgias. Negative for stiffness.  Skin: Negative for itching.  Neurological: Negative for tingling, weakness and numbness.    Social History   Tobacco Use  . Smoking status: Never Smoker  . Smokeless tobacco: Never Used  Substance Use Topics  . Alcohol use: Yes    Alcohol/week: 0.0 oz    Comment: 1-2 or more a month   Objective:   BP 108/84 (BP Location: Left Arm, Patient Position: Sitting, Cuff Size: Large)   Pulse 78   Temp 98.1 F (36.7 C) (Oral)   Resp 16   Wt 194 lb (88 kg)   SpO2 98%   BMI 27.84 kg/m  Vitals:   05/31/18 1324  BP: 108/84  Pulse: 78  Resp: 16  Temp: 98.1 F (36.7 C)  TempSrc: Oral  SpO2: 98%  Weight: 194 lb (88 kg)     Physical Exam  Constitutional: He appears well-developed and well-nourished. No distress.  HENT:  Head: Normocephalic and  atraumatic.  Neck: Normal range of motion. Neck supple.  Cardiovascular: Normal rate, regular rhythm and normal heart sounds. Exam reveals no gallop and no friction rub.  No murmur heard. Pulmonary/Chest: Effort normal and breath sounds normal. No respiratory distress. He has no wheezes. He has no rales.  Musculoskeletal:       Right shoulder: Normal.       Left shoulder: He exhibits decreased range of motion and tenderness. He exhibits no bony tenderness, no swelling, no deformity, no pain, no spasm, normal pulse and normal strength.  Limited motion with abduction, ER and ER at 90 degree abd Positive Hawkins Impingement left arm Negative Empty can, mild limited motion with allen with IR of left arm, negative drop arm.   Skin: He is not diaphoretic.  Vitals reviewed.      Assessment & Plan:     1. Impingement syndrome of left shoulder Will treat with medrol dose pak as below. Exercises given to patient on AVS. Advised may only take tylenol for pain with medrol. Call if no improvements and will get imaging and consider steroid injection, referral to ortho and/or PT. - methylPREDNISolone (MEDROL) 4 MG TBPK tablet; 6 day taper; take as directed on package instructions  Dispense: 21 tablet; Refill: 0       Margaretann LovelessJennifer M Danyia Borunda, PA-C  [facility]

## 2019-05-10 ENCOUNTER — Ambulatory Visit (INDEPENDENT_AMBULATORY_CARE_PROVIDER_SITE_OTHER): Payer: 59 | Admitting: Podiatry

## 2019-05-10 ENCOUNTER — Ambulatory Visit (INDEPENDENT_AMBULATORY_CARE_PROVIDER_SITE_OTHER): Payer: 59

## 2019-05-10 ENCOUNTER — Other Ambulatory Visit: Payer: Self-pay

## 2019-05-10 ENCOUNTER — Encounter: Payer: Self-pay | Admitting: Podiatry

## 2019-05-10 VITALS — Temp 97.2°F

## 2019-05-10 DIAGNOSIS — M722 Plantar fascial fibromatosis: Secondary | ICD-10-CM

## 2019-05-10 DIAGNOSIS — G4733 Obstructive sleep apnea (adult) (pediatric): Secondary | ICD-10-CM | POA: Insufficient documentation

## 2019-05-10 MED ORDER — METHYLPREDNISOLONE 4 MG PO TBPK: ORAL_TABLET | ORAL | 0 refills | Status: DC

## 2019-05-10 MED ORDER — MELOXICAM 15 MG PO TABS: 15.0000 mg | ORAL_TABLET | Freq: Every day | ORAL | 3 refills | Status: DC

## 2019-05-10 NOTE — Progress Notes (Signed)
  Subjective:  Patient ID: Erik [name], male    DOB: [birth year],  MRN: [id number] HPI Chief Complaint  Patient presents with  . Foot Pain    Patient presents today for left heel pain x 1 month  "it feels like a knife stabbing my foot"  He reports pain is pretty bad in the mornings and throbs by the end of day.   He has taken Aleve and used ice with some relief    63 y.o. male presents with the above complaint.   ROS: He denies fever chills nausea vomiting muscle aches pains calf pain back pain chest pain shortness of breath.  No past medical history on file. Past Surgical History:  Procedure Laterality Date  . HERNIA REPAIR    . TONSILLECTOMY      Current Outpatient Medications:  .  cetirizine (ZYRTEC) 10 MG tablet, Take 10 mg by mouth daily., Disp: , Rfl:  .  naproxen sodium (ALEVE) 220 MG tablet, Take 220 mg by mouth., Disp: , Rfl:  .  meloxicam (MOBIC) 15 MG tablet, Take 1 tablet (15 mg total) by mouth daily., Disp: 30 tablet, Rfl: 3 .  methylPREDNISolone (MEDROL DOSEPAK) 4 MG TBPK tablet, 6 day dose pack - take as directed, Disp: 21 tablet, Rfl: 0  No Known Allergies Review of Systems Objective:   Vitals:   05/10/19 0904  Temp: (!) 97.2 F (36.2 C)    General: Well developed, nourished, in no acute distress, alert and oriented x3   Dermatological: Skin is warm, dry and supple bilateral. Nails x 10 are well maintained; remaining integument appears unremarkable at this time. There are no open sores, no preulcerative lesions, no rash or signs of infection present.  Vascular: Dorsalis Pedis artery and Posterior Tibial artery pedal pulses are 2/4 bilateral with immedate capillary fill time. Pedal hair growth present. No varicosities and no lower extremity edema present bilateral.   Neruologic: Grossly intact via light touch bilateral. Vibratory intact via tuning fork bilateral. Protective threshold with Semmes Wienstein monofilament intact to all pedal sites bilateral.  Patellar and Achilles deep tendon reflexes 2+ bilateral. No Babinski or clonus noted bilateral.   Musculoskeletal: No gross boney pedal deformities bilateral. No pain, crepitus, or limitation noted with foot and ankle range of motion bilateral. Muscular strength 5/5 in all groups tested bilateral.  Gait: Unassisted, Nonantalgic.    Radiographs:  Radiographs taken today demonstrate soft tissue increase in density at the plantar fascial calcaneal insertion site of the left heel.  Assessment & Plan:   Assessment: Plantar fasciitis left.  Plan: Discussed etiology pathology conservative or surgical therapies at this point start him on a Medrol Dosepak to be followed by meloxicam provided him with an injection to his left heel 20 mg Kenalog 5 mg Marcaine after sterile Betadine skin prep.  Placed him in a plantar fascial brace and a night splint discussed appropriate shoe gear stretching exercises ice therapy.  Discussed the need for orthotics.     Erik [name], [place]

## 2019-05-10 NOTE — Patient Instructions (Signed)

## 2019-06-07 ENCOUNTER — Other Ambulatory Visit: Payer: Self-pay

## 2019-06-07 ENCOUNTER — Ambulatory Visit (INDEPENDENT_AMBULATORY_CARE_PROVIDER_SITE_OTHER): Payer: 59 | Admitting: Podiatry

## 2019-06-07 ENCOUNTER — Ambulatory Visit: Payer: 59 | Admitting: Podiatry

## 2019-06-07 ENCOUNTER — Encounter: Payer: Self-pay | Admitting: Podiatry

## 2019-06-07 VITALS — Temp 97.4°F

## 2019-06-07 DIAGNOSIS — M722 Plantar fascial fibromatosis: Secondary | ICD-10-CM

## 2019-06-07 NOTE — Progress Notes (Signed)
He presents today for follow-up of his plantar fasciitis to his left foot and pick up his orthotics he states that it somewhat better than it was good days and bad days.  Objective: Vital signs are stable alert and oriented x3.  Pulses are palpable.  He has pain on palpation medial calcaneal tubercle of the left heel though not nearly as painful as it has been in the past.  Assessment: Plantar fasciitis resolving left.  Plan: Dispensed orthotics today was given both oral and written home-going instructions for the care and use of the orthotics as well as an injection to the left heel with 20 mg Kenalog 5 mg Marcaine point of maximal tenderness.  He tolerated the procedure well without complication.

## 2019-06-07 NOTE — Patient Instructions (Signed)

## 2019-06-08 ENCOUNTER — Other Ambulatory Visit: Payer: Self-pay

## 2019-06-08 ENCOUNTER — Encounter: Payer: Self-pay | Admitting: Physician Assistant

## 2019-06-08 ENCOUNTER — Ambulatory Visit (INDEPENDENT_AMBULATORY_CARE_PROVIDER_SITE_OTHER): Payer: 59 | Admitting: Physician Assistant

## 2019-06-08 VITALS — BP 116/72 | HR 64 | Temp 98.3°F | Ht 70.0 in | Wt 187.0 lb

## 2019-06-08 DIAGNOSIS — Z6826 Body mass index (BMI) 26.0-26.9, adult: Secondary | ICD-10-CM

## 2019-06-08 DIAGNOSIS — R7303 Prediabetes: Secondary | ICD-10-CM | POA: Diagnosis not present

## 2019-06-08 DIAGNOSIS — R7989 Other specified abnormal findings of blood chemistry: Secondary | ICD-10-CM

## 2019-06-08 DIAGNOSIS — Z125 Encounter for screening for malignant neoplasm of prostate: Secondary | ICD-10-CM | POA: Diagnosis not present

## 2019-06-08 DIAGNOSIS — N433 Hydrocele, unspecified: Secondary | ICD-10-CM

## 2019-06-08 DIAGNOSIS — Z9989 Dependence on other enabling machines and devices: Secondary | ICD-10-CM

## 2019-06-08 DIAGNOSIS — Z0000 Encounter for general adult medical examination without abnormal findings: Secondary | ICD-10-CM

## 2019-06-08 DIAGNOSIS — G4733 Obstructive sleep apnea (adult) (pediatric): Secondary | ICD-10-CM

## 2019-06-08 NOTE — Progress Notes (Signed)
Patient: Erik [name], Male    DOB: 1956-01-13, 63 y.o.   MRN: [id number] Visit Date: 06/08/2019  Today's Provider: Mar Daring, PA-C   Chief Complaint  Patient presents with  . Annual Exam   Subjective:     Annual physical exam Erik [name] is a 63 y.o. male who presents today for health maintenance and complete physical. He feels fairly well. He reports not exercising. He reports he is sleeping poorly (increased anxiety and stress due to Father's health; he has esophageal cancer and has progressed to where he is not keeping food down).  -----------------------------------------------------------------  Review of Systems  Constitutional: Positive for fatigue. Negative for activity change, appetite change, chills, diaphoresis, fever and unexpected weight change.  HENT: Negative.   Eyes: Negative.   Respiratory: Negative.   Cardiovascular: Negative.   Gastrointestinal: Negative.   Endocrine: Negative.   Genitourinary: Negative.   Musculoskeletal: Positive for back pain. Negative for arthralgias, gait problem, joint swelling, myalgias, neck pain and neck stiffness.  Skin: Negative.   Allergic/Immunologic: Negative.   Neurological: Negative.   Hematological: Negative.   Psychiatric/Behavioral: Negative.     Social History      He  reports that he has never smoked. He has never used smokeless tobacco. He reports current alcohol use.       Social History   Socioeconomic History  . Marital status: Married    Spouse name: Not on file  . Number of children: Not on file  . Years of education: Not on file  . Highest education level: Not on file  Occupational History  . Not on file  Social Needs  . Financial resource strain: Not on file  . Food insecurity    Worry: Not on file    Inability: Not on file  . Transportation needs    Medical: Not on file    Non-medical: Not on file  Tobacco Use  . Smoking status: Never Smoker  . Smokeless tobacco: Never Used   Substance and Sexual Activity  . Alcohol use: Yes    Alcohol/week: 0.0 standard drinks    Comment: 1-2 or more a month  . Drug use: Not on file  . Sexual activity: Not on file  Lifestyle  . Physical activity    Days per week: Not on file    Minutes per session: Not on file  . Stress: Not on file  Relationships  . Social Herbalist on phone: Not on file    Gets together: Not on file    Attends religious service: Not on file    Active member of club or organization: Not on file    Attends meetings of clubs or organizations: Not on file    Relationship status: Not on file  Other Topics Concern  . Not on file  Social History Narrative  . Not on file    No past medical history on file.   Patient Active Problem List   Diagnosis Date Noted  . OSA on CPAP 05/10/2019  . Low serum testosterone 02/25/2018  . Vaccine counseling 03/03/2017  . Borderline diabetes 12/11/2015  . Low back pain 05/25/2014    Past Surgical History:  Procedure Laterality Date  . HERNIA REPAIR    . TONSILLECTOMY      Family History        Family Status  Relation Name Status  . Mother  Deceased  . Father  Alive  . Sister  Alive  . Brother  Alive        His family history includes Breast cancer in his mother and sister; Cancer in his father and mother; Hypertension in his brother and sister; Supraventricular tachycardia in his sister.      No Known Allergies   Current Outpatient Medications:  .  meloxicam (MOBIC) 15 MG tablet, Take 1 tablet (15 mg total) by mouth daily., Disp: 30 tablet, Rfl: 3 .  naproxen sodium (ALEVE) 220 MG tablet, Take 220 mg by mouth., Disp: , Rfl:  .  cetirizine (ZYRTEC) 10 MG tablet, Take 10 mg by mouth daily., Disp: , Rfl:  .  methylPREDNISolone (MEDROL DOSEPAK) 4 MG TBPK tablet, 6 day dose pack - take as directed (Patient not taking: Reported on 06/08/2019), Disp: 21 tablet, Rfl: 0   Patient Care Team: Margaretann LovelessBurnette,  M, PA-C as PCP - General (Family  Medicine)    Objective:    Vitals: BP 116/72 (BP Location: Right Arm, Patient Position: Sitting, Cuff Size: Large)   Pulse 64   Temp 98.3 F (36.8 C) (Oral)   Ht 5\' 10"  (1.778 m)   Wt 187 lb (84.8 kg)   BMI 26.83 kg/m    Vitals:   06/08/19 1015  BP: 116/72  Pulse: 64  Temp: 98.3 F (36.8 C)  TempSrc: Oral  Weight: 187 lb (84.8 kg)  Height: 5\' 10"  (1.778 m)     Physical Exam Vitals signs reviewed.  Constitutional:      General: He is not in acute distress.    Appearance: Normal appearance. He is well-developed and normal weight. He is not ill-appearing.  HENT:     Head: Normocephalic and atraumatic.     Right Ear: Tympanic membrane, ear canal and external ear normal.     Left Ear: Tympanic membrane, ear canal and external ear normal.     Nose: Nose normal.     Mouth/Throat:     Mouth: Mucous membranes are moist.  Eyes:     General:        Right eye: No discharge.        Left eye: No discharge.     Extraocular Movements: Extraocular movements intact.     Conjunctiva/sclera: Conjunctivae normal.     Pupils: Pupils are equal, round, and reactive to light.  Neck:     Musculoskeletal: Normal range of motion and neck supple.     Thyroid: No thyromegaly.     Vascular: No carotid bruit.     Trachea: No tracheal deviation.  Cardiovascular:     Rate and Rhythm: Normal rate and regular rhythm.     Pulses: Normal pulses.     Heart sounds: Normal heart sounds. No murmur.  Pulmonary:     Effort: Pulmonary effort is normal. No respiratory distress.     Breath sounds: Normal breath sounds. No wheezing or rales.  Chest:     Chest wall: No tenderness.  Abdominal:     General: Abdomen is flat. Bowel sounds are normal. There is no distension.     Palpations: Abdomen is soft. There is no mass.     Tenderness: There is no abdominal tenderness. There is no guarding or rebound.  Musculoskeletal: Normal range of motion.        General: No tenderness.  Lymphadenopathy:      Cervical: No cervical adenopathy.  Skin:    General: Skin is warm and dry.     Capillary Refill: Capillary refill takes less than 2 seconds.  Findings: No erythema or rash.  Neurological:     General: No focal deficit present.     Mental Status: He is alert and oriented to person, place, and time. Mental status is at baseline.     Cranial Nerves: No cranial nerve deficit.     Motor: No weakness or abnormal muscle tone.     Coordination: Coordination normal.     Gait: Gait normal.     Deep Tendon Reflexes: Reflexes are normal and symmetric.  Psychiatric:        Mood and Affect: Mood normal.        Behavior: Behavior normal.        Thought Content: Thought content normal.        Judgment: Judgment normal.      Depression Screen PHQ 2/9 Scores 06/08/2019 02/25/2018 01/14/2018  PHQ - 2 Score 2 2 2   PHQ- 9 Score 7 7 5        Assessment & Plan:     Routine Health Maintenance and Physical Exam  Exercise Activities and Dietary recommendations Goals   None     Immunization History  Administered Date(s) Administered  . Influenza-Unspecified 08/16/2014, 10/17/2015  . Tdap 11/16/2009    Health Maintenance  Topic Date Due  . INFLUENZA VACCINE  06/17/2019  . TETANUS/TDAP  11/17/2019  . COLONOSCOPY  10/23/2024  . Hepatitis C Screening  Completed  . HIV Screening  Completed     Discussed health benefits of physical activity, and encouraged him to engage in regular exercise appropriate for his age and condition.    1. Annual physical exam Normal physical exam today. Will check labs as below and f/u pending lab results. If labs are stable and WNL he will not need to have these rechecked for one year at his next annual physical exam. He is to call the office in the meantime if he has any acute issue, questions or concerns. - CBC w/Diff/Platelet - Comprehensive Metabolic Panel (CMET) - TSH - Lipid Profile - HgB A1c - Testosterone - PSA  2. Prostate cancer screening  Will check labs as below and f/u pending results. - CBC w/Diff/Platelet - PSA  3. OSA on CPAP Stable. Not using CPAP as much.  - CBC w/Diff/Platelet  4. Borderline diabetes Diet controlled. Will check labs as below and f/u pending results. - CBC w/Diff/Platelet - Comprehensive Metabolic Panel (CMET) - Lipid Profile - HgB A1c  5. Low serum testosterone H/O this but not low enough to require replacement. Will check labs as below and f/u pending results. - CBC w/Diff/Platelet - Testosterone  6. BMI 26.0-26.9,adult Counseled patient on healthy lifestyle modifications including dieting and exercise.  - CBC w/Diff/Platelet - Comprehensive Metabolic Panel (CMET) - TSH - Lipid Profile - HgB A1c  7. Hydrocele, unspecified hydrocele type Stable. Unchanged. Patient denies any pain or issues. Will continue to monitor.   --------------------------------------------------------------------    Margaretann LovelessJennifer M , PA-C  [facility]

## 2019-06-08 NOTE — Patient Instructions (Signed)

## 2019-06-09 ENCOUNTER — Telehealth: Payer: Self-pay

## 2019-06-09 LAB — COMPREHENSIVE METABOLIC PANEL
ALT: 32 IU/L (ref 0–44)
AST: 23 IU/L (ref 0–40)
Albumin/Globulin Ratio: 1.6 (ref 1.2–2.2)
Albumin: 4.4 g/dL (ref 3.8–4.8)
Alkaline Phosphatase: 64 IU/L (ref 39–117)
BUN/Creatinine Ratio: 17 (ref 10–24)
BUN: 20 mg/dL (ref 8–27)
Bilirubin Total: 0.5 mg/dL (ref 0.0–1.2)
CO2: 22 mmol/L (ref 20–29)
Calcium: 9.4 mg/dL (ref 8.6–10.2)
Chloride: 105 mmol/L (ref 96–106)
Creatinine, Ser: 1.15 mg/dL (ref 0.76–1.27)
GFR calc Af Amer: 79 mL/min/{1.73_m2} (ref >59)
GFR calc non Af Amer: 68 mL/min/{1.73_m2} (ref >59)
Globulin, Total: 2.7 g/dL (ref 1.5–4.5)
Glucose: 101 mg/dL — ABNORMAL HIGH (ref 65–99)
Potassium: 4.4 mmol/L (ref 3.5–5.2)
Sodium: 142 mmol/L (ref 134–144)
Total Protein: 7.1 g/dL (ref 6.0–8.5)

## 2019-06-09 LAB — LIPID PANEL
Chol/HDL Ratio: 2.8 ratio (ref 0.0–5.0)
Cholesterol, Total: 159 mg/dL (ref 100–199)
HDL: 56 mg/dL (ref >39)
LDL Calculated: 87 mg/dL (ref 0–99)
Triglycerides: 78 mg/dL (ref 0–149)
VLDL Cholesterol Cal: 16 mg/dL (ref 5–40)

## 2019-06-09 LAB — CBC WITH DIFFERENTIAL/PLATELET
Basophils Absolute: 0.1 10*3/uL (ref 0.0–0.2)
Basos: 1 %
EOS (ABSOLUTE): 0.1 10*3/uL (ref 0.0–0.4)
Eos: 1 %
Hematocrit: 43.5 % (ref 37.5–51.0)
Hemoglobin: 14.6 g/dL (ref 13.0–17.7)
Immature Grans (Abs): 0.1 10*3/uL (ref 0.0–0.1)
Immature Granulocytes: 1 %
Lymphocytes Absolute: 1 10*3/uL (ref 0.7–3.1)
Lymphs: 16 %
MCH: 30.3 pg (ref 26.6–33.0)
MCHC: 33.6 g/dL (ref 31.5–35.7)
MCV: 90 fL (ref 79–97)
Monocytes Absolute: 0.5 10*3/uL (ref 0.1–0.9)
Monocytes: 8 %
Neutrophils Absolute: 4.8 10*3/uL (ref 1.4–7.0)
Neutrophils: 73 %
Platelets: 232 10*3/uL (ref 150–450)
RBC: 4.82 x10E6/uL (ref 4.14–5.80)
RDW: 12.6 % (ref 11.6–15.4)
WBC: 6.5 10*3/uL (ref 3.4–10.8)

## 2019-06-09 LAB — TESTOSTERONE: Testosterone: 373 ng/dL (ref 264–916)

## 2019-06-09 LAB — HEMOGLOBIN A1C
Est. average glucose Bld gHb Est-mCnc: 117 mg/dL
Hgb A1c MFr Bld: 5.7 % — ABNORMAL HIGH (ref 4.8–5.6)

## 2019-06-09 LAB — PSA: Prostate Specific Ag, Serum: 1 ng/mL (ref 0.0–4.0)

## 2019-06-09 LAB — TSH: TSH: 1.98 u[IU]/mL (ref 0.450–4.500)

## 2019-06-09 NOTE — Telephone Encounter (Signed)
-----   Message from Jennifer M Burnette, PA-C sent at 06/09/2019 10:21 AM EDT ----- All labs are within normal limits and stable.  Thanks! -JB 

## 2019-06-09 NOTE — Telephone Encounter (Signed)
LMTCB 06/09/2019  Thanks,   -Laura  

## 2019-06-12 NOTE — Telephone Encounter (Signed)
Pt advised.   Thanks,   -Laura  

## 2019-07-10 ENCOUNTER — Telehealth: Payer: Self-pay

## 2019-07-10 ENCOUNTER — Encounter: Payer: Self-pay | Admitting: Emergency Medicine

## 2019-07-10 ENCOUNTER — Emergency Department: Payer: 59

## 2019-07-10 ENCOUNTER — Emergency Department
Admission: EM | Admit: 2019-07-10 | Discharge: 2019-07-10 | Disposition: A | Discharge status: Home / Self Care | Payer: 59 | Attending: Emergency Medicine | Admitting: Emergency Medicine

## 2019-07-10 ENCOUNTER — Other Ambulatory Visit: Payer: Self-pay

## 2019-07-10 DIAGNOSIS — Z20828 Contact with and (suspected) exposure to other viral communicable diseases: Secondary | ICD-10-CM | POA: Diagnosis not present

## 2019-07-10 DIAGNOSIS — R0602 Shortness of breath: Secondary | ICD-10-CM | POA: Diagnosis not present

## 2019-07-10 DIAGNOSIS — Z79899 Other long term (current) drug therapy: Secondary | ICD-10-CM | POA: Diagnosis not present

## 2019-07-10 LAB — CBC
HCT: 42.8 % (ref 39.0–52.0)
Hemoglobin: 14.5 g/dL (ref 13.0–17.0)
MCH: 30.7 pg (ref 26.0–34.0)
MCHC: 33.9 g/dL (ref 30.0–36.0)
MCV: 90.5 fL (ref 80.0–100.0)
Platelets: 221 10*3/uL (ref 150–400)
RBC: 4.73 MIL/uL (ref 4.22–5.81)
RDW: 13.3 % (ref 11.5–15.5)
WBC: 7.4 10*3/uL (ref 4.0–10.5)
nRBC: 0 % (ref 0.0–0.2)

## 2019-07-10 LAB — BASIC METABOLIC PANEL
Anion gap: 10 (ref 5–15)
BUN: 23 mg/dL (ref 8–23)
CO2: 22 mmol/L (ref 22–32)
Calcium: 9.4 mg/dL (ref 8.9–10.3)
Chloride: 107 mmol/L (ref 98–111)
Creatinine, Ser: 0.94 mg/dL (ref 0.61–1.24)
GFR calc Af Amer: >60 mL/min (ref >60)
GFR calc non Af Amer: >60 mL/min (ref >60)
Glucose, Bld: 100 mg/dL — ABNORMAL HIGH (ref 70–99)
Potassium: 4.2 mmol/L (ref 3.5–5.1)
Sodium: 139 mmol/L (ref 135–145)

## 2019-07-10 LAB — TROPONIN I (HIGH SENSITIVITY): Troponin I (High Sensitivity): <2 ng/L (ref <18)

## 2019-07-10 MED ORDER — LORAZEPAM 1 MG PO TABS
1.0000 mg | ORAL_TABLET | Freq: Once | ORAL | Status: AC
  Administered 2019-07-10: 11:00:00 1 mg via ORAL
  Filled 2019-07-10: qty 1

## 2019-07-10 NOTE — ED Triage Notes (Signed)
Pt in via POV, reports intermittent shortness of breath since yesterday, states, "I just feel like I cant get a good breath."  Denies hx of anxiety; does reports new stress and lack of sleep due to current family situation.  NAD noted at this time.

## 2019-07-10 NOTE — ED Notes (Signed)
Lab called to add on troponin to previous blood work  °

## 2019-07-10 NOTE — ED Provider Notes (Signed)
[facility] Emergency Department Provider Note  Time seen: 10:49 AM  I have reviewed the triage vital signs and the nursing notes.   HISTORY  Chief Complaint Shortness of Breath   HPI Erik [name] is a 63 y.o. male with no significant past medical history presents to the emergency department for shortness of breath.  According to the patient for the past 2 days he has been experiencing shortness of breath which he describes as a sensation of not being able to take a full deep breath.  During my evaluation patient will on occasion pause and take a large deep breath in and out.  Denies any chest pain denies any pleuritic pain.  Denies any leg pain or swelling.  No fever cough.  No congestion.  Patient states he has had similar issues in the past, states earlier this year he visited Dr. Fletcher Anon for similar issues and had a fairly extensive negative work-up per patient.  History reviewed. No pertinent past medical history.  Patient Active Problem List   Diagnosis Date Noted  . Hydrocele 06/08/2019  . OSA on CPAP 05/10/2019  . Low serum testosterone 02/25/2018  . Borderline diabetes 12/11/2015  . Low back pain 05/25/2014    Past Surgical History:  Procedure Laterality Date  . HERNIA REPAIR    . TONSILLECTOMY      Prior to Admission medications   Medication Sig Start Date End Date Taking? Authorizing Provider  cetirizine (ZYRTEC) 10 MG tablet Take 10 mg by mouth daily.    [provider]  meloxicam (MOBIC) 15 MG tablet Take 1 tablet (15 mg total) by mouth daily. 05/10/19   Hyatt, Max T, DPM  naproxen sodium (ALEVE) 220 MG tablet Take 220 mg by mouth.    [provider]    No Known Allergies  Family History  Problem Relation Age of Onset  . Cancer Mother        Breast  . Breast cancer Mother   . Cancer Father        Esophagus  . Hypertension Sister   . Supraventricular tachycardia Sister   . Breast cancer Sister   . Hypertension  Brother     Social History Social History   Tobacco Use  . Smoking status: Never Smoker  . Smokeless tobacco: Never Used  Substance Use Topics  . Alcohol use: Yes    Alcohol/week: 0.0 standard drinks  . Drug use: Never    Review of Systems Constitutional: Negative for fever. Cardiovascular: Negative for chest pain. Respiratory: Positive for shortness of breath.  Negative for cough. Gastrointestinal: Negative for abdominal pain Musculoskeletal: Negative for leg pain or swelling Skin: Negative for skin complaints  Neurological: Negative for headache All other ROS negative  ____________________________________________   PHYSICAL EXAM:  VITAL SIGNS: ED Triage Vitals  Enc Vitals Group     BP 07/10/19 1030 (!) 141/75     Pulse Rate 07/10/19 1030 86     Resp 07/10/19 1030 16     Temp 07/10/19 1030 98.6 F (37 C)     Temp Source 07/10/19 1030 Oral     SpO2 07/10/19 1030 98 %     Weight 07/10/19 1031 190 lb (86.2 kg)     Height 07/10/19 1031 5\' 10"  (1.778 m)     Head Circumference --      Peak Flow --      Pain Score 07/10/19 1031 0     Pain Loc --  Pain Edu? --      Excl. in GC? --    Constitutional: Alert and oriented. Well appearing and in no distress. Eyes: Normal exam ENT      Head: Normocephalic and atraumatic.      Mouth/Throat: Mucous membranes are moist. Cardiovascular: Normal rate, regular rhythm. No murmur Respiratory: Normal respiratory effort without tachypnea nor retractions. Breath sounds are clear.  No wheeze rales or rhonchi. Gastrointestinal: Soft and nontender. No distention.   Musculoskeletal: Nontender with normal range of motion in all extremities. No lower extremity tenderness or edema.  Neurologic:  Normal speech and language. No gross focal neurologic deficits  Skin:  Skin is warm, dry and intact.  Psychiatric: Mood and affect are normal.   ____________________________________________    EKG  EKG viewed and interpreted by myself  shows a normal sinus rhythm at 84 bpm with a narrow QRS, normal axis, normal intervals, no concerning ST changes.  ____________________________________________    RADIOLOGY  Chest x-ray largely negative.  Possible nipple shadow versus nodule we will have the patient follow-up with his doctor.  ____________________________________________   INITIAL IMPRESSION / ASSESSMENT AND PLAN / ED COURSE  Pertinent labs & imaging results that were available during my care of the patient were reviewed by me and considered in my medical decision making (see chart for details).   Patient presents to the emergency department for shortness of breath.  Differential would include ACS, pneumonia, pneumothorax, coinfection, anxiety.  Overall the patient appears well.  No chest pain at any point.  No leg pain or swelling.  We will check labs, obtain a chest x-ray.  We will dose a small dose of Ativan and continue to closely monitor.  Patient agreeable to plan of care.  Largely negative work-up with a negative troponin.  Appears to have a clear chest x-ray.  We will perform an outpatient swab for coronavirus and discharge with PCP follow-up.  Erik [name] was evaluated in Emergency Department on 07/10/2019 for the symptoms described in the history of present illness. He was evaluated in the context of the global COVID-19 pandemic, which necessitated consideration that the patient might be at risk for infection with the SARS-CoV-2 virus that causes COVID-19. Institutional protocols and algorithms that pertain to the evaluation of patients at risk for COVID-19 are in a state of rapid change based on information released by regulatory bodies including the CDC and federal and state organizations. These policies and algorithms were followed during the patient's care in the ED.  ____________________________________________   FINAL CLINICAL IMPRESSION(S) / ED DIAGNOSES  Dyspnea   Minna AntisPaduchowski, Shiquita Collignon, MD 07/10/19  [id number]

## 2019-07-10 NOTE — ED Notes (Signed)
Patient unhooked from monitor to use restroom. Ambulatory with steady gait and NAD noted.

## 2019-07-10 NOTE — Telephone Encounter (Signed)
Patient called complaining of Shortness of breath for the past 1-2 days. He says it feels like he cant catch his breath. He states that he has to deep breath in order to catch his breath. Patient has also experienced tightness in his chest. Patient denies any cough, fever, headache, or blurred vision. I advised patient to go to the ER for evaluation asap. Patient was in agreement with this plan and told me that he would go as soon as he got of the phone with me.

## 2019-07-10 NOTE — Discharge Instructions (Addendum)
As we discussed please follow-up with your doctor in approximately 2 weeks for recheck/reevaluation repeat chest x-ray to ensure no pulmonary nodule.  Return to the emergency department for any worsening shortness of breath, development of any chest pain, or any other symptom personally concerning to yourself

## 2019-07-10 NOTE — ED Notes (Signed)
Patient transported to X-ray 

## 2019-07-10 NOTE — Telephone Encounter (Signed)
Was involved in conversation and agree with ER eval

## 2019-07-10 NOTE — ED Notes (Signed)
Patient ambulatory to lobby with steady gait and NAD noted. Verbalized understanding of discharge instructions and follow-up care.  

## 2019-07-11 ENCOUNTER — Telehealth: Payer: Self-pay

## 2019-07-11 LAB — NOVEL CORONAVIRUS, NAA (HOSP ORDER, SEND-OUT TO REF LAB; TAT 18-24 HRS): SARS-CoV-2, NAA: NOT DETECTED

## 2019-07-11 NOTE — Telephone Encounter (Signed)
This is a larger discussion than just sending in a medication. I recommend he schedule with Tawanna Sat to talk about it.

## 2019-07-11 NOTE — Telephone Encounter (Signed)
Please advise 

## 2019-07-11 NOTE — Telephone Encounter (Signed)
Patient called and stated that he was seen in the ER on yesterday for SOB and the ER doctor stated to him it was stress/ anxiety. Patient states that the ER doctor told him to following with his PCP and patient is aware that Tawanna Sat is out of the office today 07/11/2019 and states that he will wait for her to return. Patient is wanting something for his anxiety if possible to be send into the pharmacy.L.O.V. was 06/08/2019, Please advise.

## 2019-07-12 ENCOUNTER — Telehealth: Payer: Self-pay

## 2019-07-12 NOTE — Progress Notes (Signed)
Patient: Erik [name] Male    DOB: Jan 18, 1956   63 y.o.   MRN: [id number] Visit Date: 07/13/2019  Today's Provider: Mar Daring, PA-C   No chief complaint on file.  Subjective:     HPI  Virtual Visit via Telephone Note  I connected with Erik [name] on 07/13/19 at  8:40 AM EDT by telephone and verified that I am speaking with the correct person using two identifiers.  Location: Patient: Home Provider: [facility]    Follow up ER visit  Patient was seen in ER for shortness of breath on 07/10/2019. Erik [name] was treated for dyspenea Treatment for this included labs, x-ray and Lorazepam 1 mg. Erik [name] reports excellent compliance with treatment. Erik [name] reports this condition is Unchanged. Patient reports that Erik [name] feels that Erik [name] can not take deep breaths.  Also having trouble sleeping. Reports Erik [name] can fall asleep but will wake up in an hour or so. This is even with using ZzzQuil and AlevePM.   GAD 7 : Generalized Anxiety Score 07/13/2019  Nervous, Anxious, on Edge 3  Control/stop worrying 3  Worry too much - different things 3  Trouble relaxing 1  Restless 2  Easily annoyed or irritable 0  Afraid - awful might happen 2  Total GAD 7 Score 14  Anxiety Difficulty Not difficult at all   Erik [name] is dealing with his father's failing health. Erik [name] reports they recently called Hospice care in for his father.   ------------------------------------------------------------------------------------     No Known Allergies   Current Outpatient Medications:  .  naproxen sodium (ALEVE) 220 MG tablet, Take 220 mg by mouth daily as needed. , Disp: , Rfl:  .  Multiple Vitamin (MULTIVITAMIN WITH MINERALS) TABS tablet, Take 1 tablet by mouth daily., Disp: , Rfl:   Review of Systems  Constitutional: Negative.   HENT: Negative for congestion, postnasal drip, rhinorrhea, sinus pressure, sinus pain, sore throat, trouble swallowing and voice change.   Respiratory: Positive for shortness of breath. Negative  for cough, chest tightness and wheezing.   Cardiovascular: Negative.   Neurological: Negative.   Psychiatric/Behavioral: Positive for sleep disturbance. The patient is nervous/anxious.     Social History   Tobacco Use  . Smoking status: Never Smoker  . Smokeless tobacco: Never Used  Substance Use Topics  . Alcohol use: Yes    Alcohol/week: 0.0 standard drinks      Objective:   BP 138/86 (BP Location: Left Arm, Patient Position: Sitting, Cuff Size: Normal)   Pulse 73   Temp 98.5 F (36.9 C)   Wt 187 lb (84.8 kg)   BMI 26.83 kg/m  Vitals:   07/13/19 0828 07/13/19 0832  BP:  138/86  Pulse:  73  Temp: 98.5 F (36.9 C) 98.5 F (36.9 C)  Weight:  187 lb (84.8 kg)     Physical Exam Constitutional:      General: Erik [name] is not in acute distress. Pulmonary:     Effort: No respiratory distress.     Comments: Did have moments where Erik [name] would have to stop and catch his breath even though Erik [name] was talking fine right before Neurological:     Mental Status: Erik [name] is alert.     CLINICAL DATA:  Shortness of breath.  EXAM: CHEST - 2 VIEW  COMPARISON:  None.  FINDINGS: There is a 10 mm nodular density overlying the right lower lung zone. The lungs are otherwise clear. Heart size and vascularity are normal. No effusions. Bones are  normal.  IMPRESSION: Nodular density at the right lung base. This may represent a nipple shadow rather than a pulmonary nodule. I recommend a repeat PA chest with nipple markers for initial further evaluation.  Otherwise, normal exam.   Electronically Signed   By: Francene BoyersJames  Maxwell M.D.   On: 07/10/2019 11:10 No results found for any visits on 07/13/19.     Assessment & Plan     1. Primary insomnia Will start trazodone as below for sleep issues and anxiety. Erik [name] will call back to schedule a f/u in 1-2 weeks. May need daily medication like citalopram for anxiety. May also consider hydroxyzine as well.  - traZODone (DESYREL) 50 MG tablet; Take  0.5-1 tablets (25-50 mg total) by mouth at bedtime as needed for sleep.  Dispense: 30 tablet; Refill: 3  2. Situational anxiety See above medical treatment plan. - traZODone (DESYREL) 50 MG tablet; Take 0.5-1 tablets (25-50 mg total) by mouth at bedtime as needed for sleep.  Dispense: 30 tablet; Refill: 3  3. Shortness of breath New. Unchanged. CXR possibly abnormal. Since symptomatic will get chest CT as below. Will also refer to pulmonology as below for consideration of PFTs since we are unable to perform in our office due to covid restrictions.  - CT CHEST WO CONTRAST; Future - Ambulatory referral to Pulmonology  I discussed the assessment and treatment plan with the patient. The patient was provided an opportunity to ask questions and all were answered. The patient agreed with the plan and demonstrated an understanding of the instructions.   The patient was advised to call back or seek an in-person evaluation if the symptoms worsen or if the condition fails to improve as anticipated.  I spent approximately 17 minutes of non face-to-face time with the patient.   Margaretann LovelessJennifer M Burnette, PA-C  [facility]

## 2019-07-12 NOTE — Telephone Encounter (Signed)
Patient seen in ER for SOB.  States he is still having trouble and also having trouble sleeping.  Would like to discuss what he can do to help his stress and SOB.    [phone]

## 2019-07-12 NOTE — Telephone Encounter (Signed)
NEEDS APPT 

## 2019-07-13 ENCOUNTER — Ambulatory Visit (INDEPENDENT_AMBULATORY_CARE_PROVIDER_SITE_OTHER): Payer: 59 | Admitting: Physician Assistant

## 2019-07-13 ENCOUNTER — Encounter: Payer: Self-pay | Admitting: Physician Assistant

## 2019-07-13 VITALS — BP 138/86 | HR 73 | Temp 98.5°F | Wt 187.0 lb

## 2019-07-13 DIAGNOSIS — R0602 Shortness of breath: Secondary | ICD-10-CM

## 2019-07-13 DIAGNOSIS — F5101 Primary insomnia: Secondary | ICD-10-CM

## 2019-07-13 DIAGNOSIS — F418 Other specified anxiety disorders: Secondary | ICD-10-CM

## 2019-07-13 MED ORDER — TRAZODONE HCL 50 MG PO TABS: 25.0000 mg | ORAL_TABLET | Freq: Every evening | ORAL | 3 refills | Status: DC | PRN

## 2019-07-13 NOTE — Progress Notes (Addendum)
[facility] [place]      Assessment and Plan:  Dyspnea on exertion with chest tightness. Lung nodule seen on chest x-ray. - Given his recent onset symptoms, will need to rule out pulmonary embolism. - We will check CT chest to rule out thromboembolic disease. -He is given a sample of Asmanex inhaler to be used for the next week empirically.  If he finds that it helps, he can call us for a prescription, otherwise he is to stop it.  Would avoid beta agonists unless absolutely indicated as they may exacerbate his anxiety at this point.  Insomnia and anxiety. - Patient appears to have evidence of adjustment disorder, possibly related to his father's illness. - He has applied for FMLA and may be taking time off work to spend time with his father who is in hospice which I encouraged him to do. - If all other testing and empiric treatment above is unrevealing, we discussed that this is likely secondary to anxiety/panic disorder.  In that case he can continue taking Aleve PM at night to help with sleep, and follow-up with his primary to look into further management of anxiety.  Orders Placed This Encounter  Procedures  . CT ANGIO CHEST PE W OR WO CONTRAST    Addendum: CT chest reviewed, no evidence of PE, lung are normal, there is a splenic aneurysm per radiology report that requires surveillance. Pt informed of results via telephone.    Date: 07/14/2019  MRN# [id number] Erik [name] 02-Nov-1956    Erik [name] is a 63 y.o. old male seen in consultation for chief complaint of:    Chief Complaint  Patient presents with  . pulmonary consult    per Joycelyn ManJennifer Burnette- pt states that he was awaken during night on saturday with sob, he feels that he is unable to take a good deep breath in.     HPI:  Erik [name] is a 63 y.o. old male with a history of insomnia, anxiety, OSA.  He was recently started on trazodone 50 mg once nightly by his PCP.  He is here today  because he has been having trouble catching his breath. He notes that he tries to take in a deep breath but he does not feel that the air is going in with chest tightness. He notes that problem happened again recently and woke him up this past weekend at 2 am. He went to the ED, imaging and blood work for ACS was negative. He was treated with ativa which helped somewhat. He notes that he has been under a lot of stress and he has not been sleeping at night.  Lost his mother to cancer a few years ago, and recently his father is enrolled in hospice due to esophageal cancer.  He has been  taking aleve PM to help him sleep which helps him sleep through the night.  He was recently prescribed tramadol by his primary, but he found that when he took this it made him feel tired during the day, therefore he stopped it.  He has not been tried on any inhalers and has no history of asthma or other previous respiratory conditions.  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  He has no pets. He has occasional heartburn.  He has been tested for allergies several years ago, positive for trees, grass, he got injections and felt that they helped.  Minimal second hand  smoke exposure. No known occupational exposures. Though worked in [employer] in the 80's.   **Desat walk 07/14/2019>> at rest on RA. Sat is 98% and HR 80, walked 360 feet at normal pace mild dyspnea sat is 97% and HR 92,  **Chest x-ray 07/10/2019>> imaging personally reviewed, lungs appear normal.  Per report there is a possible right lower lobe density which may represent nipple shadow.  Repeat chest x-ray was recommended with nipple marker.  PMHX:   No past medical history on file. Surgical Hx:  Past Surgical History:  Procedure Laterality Date  . HERNIA REPAIR    . TONSILLECTOMY     Family Hx:  Family History  Problem Relation Age of Onset  . Cancer Mother        Breast  . Breast cancer Mother    . Cancer Father        Esophagus  . Hypertension Sister   . Supraventricular tachycardia Sister   . Breast cancer Sister   . Hypertension Brother    Social Hx:   Social History   Tobacco Use  . Smoking status: Never Smoker  . Smokeless tobacco: Never Used  Substance Use Topics  . Alcohol use: Yes    Alcohol/week: 0.0 standard drinks  . Drug use: Never   Medication:    Current Outpatient Medications:  Marland Kitchen  Multiple Vitamin (MULTIVITAMIN WITH MINERALS) TABS tablet, Take 1 tablet by mouth daily., Disp: , Rfl:  .  naproxen sodium (ALEVE) 220 MG tablet, Take 220 mg by mouth daily as needed. , Disp: , Rfl:  .  traZODone (DESYREL) 50 MG tablet, Take 0.5-1 tablets (25-50 mg total) by mouth at bedtime as needed for sleep., Disp: 30 tablet, Rfl: 3   Allergies:  Patient has no known allergies.  Review of Systems: Gen:  Denies  fever, sweats, chills HEENT: Denies blurred vision, double vision. bleeds, sore throat Cvc:  No dizziness, chest pain. Resp:   Denies cough or sputum production, shortness of breath Gi: Denies swallowing difficulty, stomach pain. Gu:  Denies bladder incontinence, burning urine Ext:   No Joint pain, stiffness. No LE edema of calf pain or tenderness. He does have some left foot pain from plantar fascia inflammation.  Skin: No skin rash,  hives  Endoc:  No polyuria, polydipsia. Psych: No depression, insomnia. Other:  All other systems were reviewed with the patient and were negative other that what is mentioned in the HPI.   Physical Examination:   VS: BP 120/70 (BP Location: Left Arm, Cuff Size: Normal)   Pulse 80   Temp 97.9 F (36.6 C) (Temporal)   Ht 5\' 10"  (1.778 m)   Wt 190 lb 12.8 oz (86.5 kg)   SpO2 96%   BMI 27.38 kg/m   General Appearance: No distress, appears anxious, patient takes occasional intermittent deep sighing respirations. Neuro:without focal findings,  speech normal,  HEENT: PERRLA, EOM intact.   Pulmonary: normal breath sounds, No  wheezing.  CardiovascularNormal S1,S2.  No m/r/g.   Abdomen: Benign, Soft, non-tender. Renal:  No costovertebral tenderness  GU:  No performed at this time. Endoc: No evident thyromegaly, no signs of acromegaly. Skin:   warm, no rashes, no ecchymosis  Extremities: normal, no cyanosis, clubbing.  Other findings:    LABORATORY PANEL:   CBC Recent Labs  Lab 07/10/19 1109  WBC 7.4  HGB 14.5  HCT 42.8  PLT 221   ------------------------------------------------------------------------------------------------------------------  Chemistries  Recent Labs  Lab 07/10/19 1109  NA 139  K 4.2  CL 107  CO2 22  GLUCOSE 100*  BUN 23  CREATININE 0.94  CALCIUM 9.4   ------------------------------------------------------------------------------------------------------------------  Cardiac Enzymes No results for input(s): TROPONINI in the last 168 hours. ------------------------------------------------------------  RADIOLOGY:  No results found.     Thank  you for the consultation and for allowing [facility] to assist in the care of your patient. Our recommendations are noted above.  Please contact us if we can be of further service.   Wells Guiles, M.D., F.C.C.P.  Board Certified in Internal Medicine, Pulmonary Medicine, Critical Care Medicine, and Sleep Medicine.  [place] Office Number: [phone]   07/14/2019

## 2019-07-13 NOTE — Patient Instructions (Signed)
Trazodone tablets What is this medicine? TRAZODONE (TRAZ oh done) is used to treat depression. This medicine may be used for other purposes; ask your health care provider or pharmacist if you have questions. COMMON BRAND NAME(S): Desyrel What should I tell my health care provider before I take this medicine? They need to know if you have any of these conditions:  attempted suicide or thinking about it  bipolar disorder  bleeding problems  glaucoma  heart disease, or previous heart attack  irregular heart beat  kidney or liver disease  low levels of sodium in the blood  an unusual or allergic reaction to trazodone, other medicines, foods, dyes or preservatives  pregnant or trying to get pregnant  breast-feeding How should I use this medicine? Take this medicine by mouth with a glass of water. Follow the directions on the prescription label. Take this medicine shortly after a meal or a light snack. Take your medicine at regular intervals. Do not take your medicine more often than directed. Do not stop taking this medicine suddenly except upon the advice of your doctor. Stopping this medicine too quickly may cause serious side effects or your condition may worsen. A special MedGuide will be given to you by the pharmacist with each prescription and refill. Be sure to read this information carefully each time. Talk to your pediatrician regarding the use of this medicine in children. Special care may be needed. Overdosage: If you think you have taken too much of this medicine contact a poison control center or emergency room at once. NOTE: This medicine is only for you. Do not share this medicine with others. What if I miss a dose? If you miss a dose, take it as soon as you can. If it is almost time for your next dose, take only that dose. Do not take double or extra doses. What may interact with this medicine? Do not take this medicine with any of the following  medications:  certain medicines for fungal infections like fluconazole, itraconazole, ketoconazole, posaconazole, voriconazole  cisapride  dronedarone  linezolid  MAOIs like Carbex, Eldepryl, Marplan, Nardil, and Parnate  mesoridazine  methylene blue (injected into a vein)  pimozide  saquinavir  thioridazine This medicine may also interact with the following medications:  alcohol  antiviral medicines for HIV or AIDS  aspirin and aspirin-like medicines  barbiturates like phenobarbital  certain medicines for blood pressure, heart disease, irregular heart beat  certain medicines for depression, anxiety, or psychotic disturbances  certain medicines for migraine headache like almotriptan, eletriptan, frovatriptan, naratriptan, rizatriptan, sumatriptan, zolmitriptan  certain medicines for seizures like carbamazepine and phenytoin  certain medicines for sleep  certain medicines that treat or prevent blood clots like dalteparin, enoxaparin, warfarin  digoxin  fentanyl  lithium  NSAIDS, medicines for pain and inflammation, like ibuprofen or naproxen  other medicines that prolong the QT interval (cause an abnormal heart rhythm) like dofetilide  rasagiline  supplements like St. John's wort, kava kava, valerian  tramadol  tryptophan This list may not describe all possible interactions. Give your health care provider a list of all the medicines, herbs, non-prescription drugs, or dietary supplements you use. Also tell them if you smoke, drink alcohol, or use illegal drugs. Some items may interact with your medicine. What should I watch for while using this medicine? Tell your doctor if your symptoms do not get better or if they get worse. Visit your doctor or health care professional for regular checks on your progress. Because it may take   several weeks to see the full effects of this medicine, it is important to continue your treatment as prescribed by your  doctor. Patients and their families should watch out for new or worsening thoughts of suicide or depression. Also watch out for sudden changes in feelings such as feeling anxious, agitated, panicky, irritable, hostile, aggressive, impulsive, severely restless, overly excited and hyperactive, or not being able to sleep. If this happens, especially at the beginning of treatment or after a change in dose, call your health care professional. You may get drowsy or dizzy. Do not drive, use machinery, or do anything that needs mental alertness until you know how this medicine affects you. Do not stand or sit up quickly, especially if you are an older patient. This reduces the risk of dizzy or fainting spells. Alcohol may interfere with the effect of this medicine. Avoid alcoholic drinks. This medicine may cause dry eyes and blurred vision. If you wear contact lenses you may feel some discomfort. Lubricating drops may help. See your eye doctor if the problem does not go away or is severe. Your mouth may get dry. Chewing sugarless gum, sucking hard candy and drinking plenty of water may help. Contact your doctor if the problem does not go away or is severe. What side effects may I notice from receiving this medicine? Side effects that you should report to your doctor or health care professional as soon as possible:  allergic reactions like skin rash, itching or hives, swelling of the face, lips, or tongue  elevated mood, decreased need for sleep, racing thoughts, impulsive behavior  confusion  fast, irregular heartbeat  feeling faint or lightheaded, falls  feeling agitated, angry, or irritable  loss of balance or coordination  painful or prolonged erections  restlessness, pacing, inability to keep still  suicidal thoughts or other mood changes  tremors  trouble sleeping  seizures  unusual bleeding or bruising Side effects that usually do not require medical attention (report to your doctor  or health care professional if they continue or are bothersome):  change in sex drive or performance  change in appetite or weight  constipation  headache  muscle aches or pains  nausea This list may not describe all possible side effects. Call your doctor for medical advice about side effects. You may report side effects to FDA at 1-800-FDA-1088. Where should I keep my medicine? Keep out of the reach of children. Store at room temperature between 15 and 30 degrees C (59 to 86 degrees F). Protect from light. Keep container tightly closed. Throw away any unused medicine after the expiration date. NOTE: This sheet is a summary. It may not cover all possible information. If you have questions about this medicine, talk to your doctor, pharmacist, or health care provider.  2020 Elsevier/Gold Standard (2018-10-25 11:46:46)  

## 2019-07-14 ENCOUNTER — Encounter: Payer: Self-pay | Admitting: Internal Medicine

## 2019-07-14 ENCOUNTER — Ambulatory Visit
Admission: RE | Admit: 2019-07-14 | Discharge: 2019-07-14 | Disposition: A | Discharge status: Home / Self Care | Payer: 59 | Source: Ambulatory Visit | Attending: Internal Medicine | Admitting: Internal Medicine

## 2019-07-14 ENCOUNTER — Ambulatory Visit (INDEPENDENT_AMBULATORY_CARE_PROVIDER_SITE_OTHER): Payer: 59 | Admitting: Internal Medicine

## 2019-07-14 ENCOUNTER — Other Ambulatory Visit: Payer: Self-pay

## 2019-07-14 VITALS — BP 120/70 | HR 80 | Temp 97.9°F | Ht 70.0 in | Wt 190.8 lb

## 2019-07-14 DIAGNOSIS — R918 Other nonspecific abnormal finding of lung field: Secondary | ICD-10-CM | POA: Diagnosis not present

## 2019-07-14 DIAGNOSIS — J4520 Mild intermittent asthma, uncomplicated: Secondary | ICD-10-CM | POA: Diagnosis not present

## 2019-07-14 DIAGNOSIS — I2699 Other pulmonary embolism without acute cor pulmonale: Secondary | ICD-10-CM | POA: Diagnosis not present

## 2019-07-14 MED ORDER — IOHEXOL 350 MG/ML SOLN
75.0000 mL | Freq: Once | INTRAVENOUS | Status: AC | PRN
  Administered 2019-07-14: 75 mL via INTRAVENOUS

## 2019-07-14 MED ORDER — MOMETASONE FUROATE 220 MCG/INH IN AEPB: 1.0000 | INHALATION_SPRAY | Freq: Every day | RESPIRATORY_TRACT | 2 refills | Status: DC

## 2019-07-14 NOTE — Patient Instructions (Signed)
Start asmanex sample one puff twice per day, rinse mouth after use. If it helps after one week call us and we will give you a prescription, if not, then stop it.   Will check a CT chest.   If all of this is negative, I suspect that the major problem is anxiety and would recommend that you follow up with your PCP for further treatment.

## 2019-07-19 ENCOUNTER — Telehealth: Payer: Self-pay | Admitting: Physician Assistant

## 2019-07-19 NOTE — Telephone Encounter (Signed)
No

## 2019-07-19 NOTE — Telephone Encounter (Signed)
Pt had CT angio ordered by [facility]  on 07/14/19. Does he still need chest CT ?

## 2019-08-07 ENCOUNTER — Telehealth (INDEPENDENT_AMBULATORY_CARE_PROVIDER_SITE_OTHER): Payer: 59 | Admitting: Physician Assistant

## 2019-08-07 ENCOUNTER — Encounter: Payer: Self-pay | Admitting: Physician Assistant

## 2019-08-07 DIAGNOSIS — F411 Generalized anxiety disorder: Secondary | ICD-10-CM | POA: Insufficient documentation

## 2019-08-07 MED ORDER — SERTRALINE HCL 50 MG PO TABS: 50.0000 mg | ORAL_TABLET | Freq: Every day | ORAL | 0 refills | Status: DC

## 2019-08-07 NOTE — Patient Instructions (Signed)
Sertraline tablets What is this medicine? SERTRALINE (SER tra leen) is used to treat depression. It may also be used to treat obsessive compulsive disorder, panic disorder, post-trauma stress, premenstrual dysphoric disorder (PMDD) or social anxiety. This medicine may be used for other purposes; ask your health care provider or pharmacist if you have questions. COMMON BRAND NAME(S): Zoloft What should I tell my health care provider before I take this medicine? They need to know if you have any of these conditions:  bleeding disorders  bipolar disorder or a family history of bipolar disorder  glaucoma  heart disease  high blood pressure  history of irregular heartbeat  history of low levels of calcium, magnesium, or potassium in the blood  if you often drink alcohol  liver disease  receiving electroconvulsive therapy  seizures  suicidal thoughts, plans, or attempt; a previous suicide attempt by you or a family member  take medicines that treat or prevent blood clots  thyroid disease  an unusual or allergic reaction to sertraline, other medicines, foods, dyes, or preservatives  pregnant or trying to get pregnant  breast-feeding How should I use this medicine? Take this medicine by mouth with a glass of water. Follow the directions on the prescription label. You can take it with or without food. Take your medicine at regular intervals. Do not take your medicine more often than directed. Do not stop taking this medicine suddenly except upon the advice of your doctor. Stopping this medicine too quickly may cause serious side effects or your condition may worsen. A special MedGuide will be given to you by the pharmacist with each prescription and refill. Be sure to read this information carefully each time. Talk to your pediatrician regarding the use of this medicine in children. While this drug may be prescribed for children as young as 7 years for selected conditions,  precautions do apply. Overdosage: If you think you have taken too much of this medicine contact a poison control center or emergency room at once. NOTE: This medicine is only for you. Do not share this medicine with others. What if I miss a dose? If you miss a dose, take it as soon as you can. If it is almost time for your next dose, take only that dose. Do not take double or extra doses. What may interact with this medicine? Do not take this medicine with any of the following medications:  cisapride  dronedarone  linezolid  MAOIs like Carbex, Eldepryl, Marplan, Nardil, and Parnate  methylene blue (injected into a vein)  pimozide  thioridazine This medicine may also interact with the following medications:  alcohol  amphetamines  aspirin and aspirin-like medicines  certain medicines for depression, anxiety, or psychotic disturbances  certain medicines for fungal infections like ketoconazole, fluconazole, posaconazole, and itraconazole  certain medicines for irregular heart beat like flecainide, quinidine, propafenone  certain medicines for migraine headaches like almotriptan, eletriptan, frovatriptan, naratriptan, rizatriptan, sumatriptan, zolmitriptan  certain medicines for sleep  certain medicines for seizures like carbamazepine, valproic acid, phenytoin  certain medicines that treat or prevent blood clots like warfarin, enoxaparin, dalteparin  cimetidine  digoxin  diuretics  fentanyl  isoniazid  lithium  NSAIDs, medicines for pain and inflammation, like ibuprofen or naproxen  other medicines that prolong the QT interval (cause an abnormal heart rhythm) like dofetilide  rasagiline  safinamide  supplements like St. John's wort, kava kava, valerian  tolbutamide  tramadol  tryptophan This list may not describe all possible interactions. Give your health care provider   a list of all the medicines, herbs, non-prescription drugs, or dietary supplements  you use. Also tell them if you smoke, drink alcohol, or use illegal drugs. Some items may interact with your medicine. What should I watch for while using this medicine? Tell your doctor if your symptoms do not get better or if they get worse. Visit your doctor or health care professional for regular checks on your progress. Because it may take several weeks to see the full effects of this medicine, it is important to continue your treatment as prescribed by your doctor. Patients and their families should watch out for new or worsening thoughts of suicide or depression. Also watch out for sudden changes in feelings such as feeling anxious, agitated, panicky, irritable, hostile, aggressive, impulsive, severely restless, overly excited and hyperactive, or not being able to sleep. If this happens, especially at the beginning of treatment or after a change in dose, call your health care professional. You may get drowsy or dizzy. Do not drive, use machinery, or do anything that needs mental alertness until you know how this medicine affects you. Do not stand or sit up quickly, especially if you are an older patient. This reduces the risk of dizzy or fainting spells. Alcohol may interfere with the effect of this medicine. Avoid alcoholic drinks. Your mouth may get dry. Chewing sugarless gum or sucking hard candy, and drinking plenty of water may help. Contact your doctor if the problem does not go away or is severe. What side effects may I notice from receiving this medicine? Side effects that you should report to your doctor or health care professional as soon as possible:  allergic reactions like skin rash, itching or hives, swelling of the face, lips, or tongue  anxious  black, tarry stools  changes in vision  confusion  elevated mood, decreased need for sleep, racing thoughts, impulsive behavior  eye pain  fast, irregular heartbeat  feeling faint or lightheaded, falls  feeling agitated,  angry, or irritable  hallucination, loss of contact with reality  loss of balance or coordination  loss of memory  painful or prolonged erections  restlessness, pacing, inability to keep still  seizures  stiff muscles  suicidal thoughts or other mood changes  trouble sleeping  unusual bleeding or bruising  unusually weak or tired  vomiting Side effects that usually do not require medical attention (report to your doctor or health care professional if they continue or are bothersome):  change in appetite or weight  change in sex drive or performance  diarrhea  increased sweating  indigestion, nausea  tremors This list may not describe all possible side effects. Call your doctor for medical advice about side effects. You may report side effects to FDA at 1-800-FDA-1088. Where should I keep my medicine? Keep out of the reach of children. Store at room temperature between 15 and 30 degrees C (59 and 86 degrees F). Throw away any unused medicine after the expiration date. NOTE: This sheet is a summary. It may not cover all possible information. If you have questions about this medicine, talk to your doctor, pharmacist, or health care provider.  2020 Elsevier/Gold Standard (2018-10-25 10:09:27)  

## 2019-08-07 NOTE — Progress Notes (Signed)
Patient: Erik EarthlyDwight E Revelle Male    DOB: [birth year]   63 y.o.   MRN: [id number] Visit Date: 08/07/2019  Today's Provider: Margaretann LovelessJennifer M Onna Nodal, PA-C   Chief Complaint  Patient presents with  . Shortness of Breath   Subjective:    I,Erik [name],RMA am acting as a Neurosurgeonscribe for PPG IndustriesJennifer M. Jayten Gabbard, PA-C.  Virtual Visit via Telephone Note  I connected with Erik [name] on 08/07/19 at  1:20 PM EDT by telephone and verified that I am speaking with the correct person using two identifiers.  Location: Patient: Home Provider: [facility]   I discussed the limitations, risks, security and privacy concerns of performing an evaluation and management service by telephone and the availability of in person appointments. I also discussed with the patient that there may be a patient responsible charge related to this service. The patient expressed understanding and agreed to proceed.   HPI  Patient with c/o sob feels is more anxiety. Reports that this flares up when he is around a place that is crowded with a lot of people.Patient was seen at the ED for SOB on 07/10/2019 had a CT scan and it was normal and was told is probably Anxiety.  Taking Mucinex-D for his allergies.   Patient reports that he stopped the Trazodone because it was not helping but got OTC Aleve PM and is helping. Still awakening 4 times per night. Occasionally for the bathroom, occasionally for the SOB. When he gets up for the SOB, he can walk around and calm himself down and then eventually be able to fall back asleep. He also works split shifts and this also affects his sleep.   No Known Allergies   Current Outpatient Medications:  Marland Kitchen.  Multiple Vitamin (MULTIVITAMIN WITH MINERALS) TABS tablet, Take 1 tablet by mouth daily., Disp: , Rfl:  .  naproxen sodium (ALEVE) 220 MG tablet, Take 220 mg by mouth daily as needed. , Disp: , Rfl:  .  mometasone (ASMANEX) 220 MCG/INH inhaler, Inhale 1 puff into the lungs daily. (Patient not  taking: Reported on 08/07/2019), Disp: 1 Inhaler, Rfl: 2  Review of Systems  Constitutional: Positive for fatigue.  Respiratory: Negative.   Cardiovascular: Negative.   Neurological: Negative.   Psychiatric/Behavioral: Positive for sleep disturbance. The patient is nervous/anxious.     Social History   Tobacco Use  . Smoking status: Never Smoker  . Smokeless tobacco: Never Used  Substance Use Topics  . Alcohol use: Yes    Alcohol/week: 0.0 standard drinks      Objective:   Temp 98.4 F (36.9 C) (Tympanic)   Wt 190 lb (86.2 kg)   BMI 27.26 kg/m  Vitals:   08/07/19 1156  Temp: 98.4 F (36.9 C)  TempSrc: Tympanic  Weight: 190 lb (86.2 kg)  Body mass index is 27.26 kg/m.   Physical Exam Vitals signs reviewed.  Constitutional:      General: He is not in acute distress. Pulmonary:     Effort: Pulmonary effort is normal. No respiratory distress.  Neurological:     Mental Status: He is alert.      No results found for any visits on 08/07/19.     Assessment & Plan     1. GAD (generalized anxiety disorder) Patient stopped trazodone and is using aleve PM with some relief in sleeping. Anxiety is still severe and is making him start to alter his normal daily routine slightly. Will start sertraline as below. I will  f/u with him in 4-6 weeks to make sure symptoms are hopefully improving.  - sertraline (ZOLOFT) 50 MG tablet; Take 1 tablet (50 mg total) by mouth daily. Start with 0.5 tab (25mg ) PO q hs x 1 week, then increase to 1 tab PO q hs  Dispense: 90 tablet; Refill: 0   I discussed the assessment and treatment plan with the patient. The patient was provided an opportunity to ask questions and all were answered. The patient agreed with the plan and demonstrated an understanding of the instructions.   The patient was advised to call back or seek an in-person evaluation if the symptoms worsen or if the condition fails to improve as anticipated.  I provided 13 minutes  of non-face-to-face time during this encounter.    Mar Daring, PA-C  [facility]

## 2019-11-25 ENCOUNTER — Other Ambulatory Visit: Payer: Self-pay

## 2019-11-25 ENCOUNTER — Emergency Department
Admission: EM | Admit: 2019-11-25 | Discharge: 2019-11-25 | Disposition: A | Discharge status: Home / Self Care | Payer: 59 | Attending: Emergency Medicine | Admitting: Emergency Medicine

## 2019-11-25 DIAGNOSIS — Y999 Unspecified external cause status: Secondary | ICD-10-CM | POA: Insufficient documentation

## 2019-11-25 DIAGNOSIS — Y9389 Activity, other specified: Secondary | ICD-10-CM | POA: Diagnosis not present

## 2019-11-25 DIAGNOSIS — Z79899 Other long term (current) drug therapy: Secondary | ICD-10-CM | POA: Diagnosis not present

## 2019-11-25 DIAGNOSIS — M7918 Myalgia, other site: Secondary | ICD-10-CM

## 2019-11-25 DIAGNOSIS — M542 Cervicalgia: Secondary | ICD-10-CM | POA: Insufficient documentation

## 2019-11-25 DIAGNOSIS — Y92410 Unspecified street and highway as the place of occurrence of the external cause: Secondary | ICD-10-CM | POA: Insufficient documentation

## 2019-11-25 DIAGNOSIS — M545 Low back pain: Secondary | ICD-10-CM | POA: Diagnosis not present

## 2019-11-25 MED ORDER — CYCLOBENZAPRINE HCL 5 MG PO TABS: 5.0000 mg | ORAL_TABLET | Freq: Three times a day (TID) | ORAL | 0 refills | Status: DC | PRN

## 2019-11-25 MED ORDER — NAPROXEN 500 MG PO TABS
500.0000 mg | ORAL_TABLET | Freq: Once | ORAL | Status: AC
  Administered 2019-11-25: 23:00:00 500 mg via ORAL
  Filled 2019-11-25: qty 1

## 2019-11-25 MED ORDER — NAPROXEN 500 MG PO TABS: 500.0000 mg | ORAL_TABLET | Freq: Two times a day (BID) | ORAL | 0 refills | Status: AC

## 2019-11-25 MED ORDER — CYCLOBENZAPRINE HCL 10 MG PO TABS
10.0000 mg | ORAL_TABLET | Freq: Once | ORAL | Status: AC
  Administered 2019-11-25: 10 mg via ORAL
  Filled 2019-11-25: qty 1
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
## 2019-12-25 ENCOUNTER — Telehealth: Payer: Self-pay

## 2019-12-25 ENCOUNTER — Encounter: Payer: Self-pay | Admitting: Physician Assistant

## 2019-12-25 ENCOUNTER — Ambulatory Visit (INDEPENDENT_AMBULATORY_CARE_PROVIDER_SITE_OTHER): Payer: 59 | Admitting: Physician Assistant

## 2019-12-25 VITALS — HR 98 | Temp 99.5°F

## 2019-12-25 DIAGNOSIS — U071 COVID-19: Secondary | ICD-10-CM | POA: Diagnosis not present

## 2019-12-25 DIAGNOSIS — R110 Nausea: Secondary | ICD-10-CM

## 2019-12-25 MED ORDER — ONDANSETRON HCL 4 MG PO TABS: 4.0000 mg | ORAL_TABLET | Freq: Three times a day (TID) | ORAL | 0 refills | Status: DC | PRN

## 2019-12-25 NOTE — Progress Notes (Signed)
Patient: Erik [name] Male    DOB: 1956/10/03   64 y.o.   MRN: [id number] Visit Date: 12/25/2019  Today's Provider: Margaretann Loveless, PA-C   Chief Complaint  Patient presents with  . COVID-19   Subjective:     Virtual Visit via Telephone Note  I connected with Erik [name] on 12/25/19 at  4:20 PM EST by telephone and verified that I am speaking with the correct person using two identifiers.  Location: Patient: Home Provider: [facility]   I discussed the limitations, risks, security and privacy concerns of performing an evaluation and management service by telephone and the availability of in person appointments. I also discussed with the patient that there may be a patient responsible charge related to this service. The patient expressed understanding and agreed to proceed.   HPI  Patient went to urgent care on Friday and was pos for COVID-19. He is using the albuterol inhaler, taking the [employer]. His O2 at Urgent care was 89% Temp 99.5 with Tylenol at 11 am.  Currently O2 sat at 92-93% RA while at rest.   No Known Allergies   Current Outpatient Medications:  .  cyclobenzaprine (FLEXERIL) 5 MG tablet, Take 1 tablet (5 mg total) by mouth 3 (three) times daily as needed. (Patient not taking: Reported on 12/25/2019), Disp: 15 tablet, Rfl: 0 .  mometasone (ASMANEX) 220 MCG/INH inhaler, Inhale 1 puff into the lungs daily. (Patient not taking: Reported on 08/07/2019), Disp: 1 Inhaler, Rfl: 2 .  Multiple Vitamin (MULTIVITAMIN WITH MINERALS) TABS tablet, Take 1 tablet by mouth daily., Disp: , Rfl:  .  naproxen sodium (ALEVE) 220 MG tablet, Take 220 mg by mouth daily as needed. , Disp: , Rfl:  .  sertraline (ZOLOFT) 50 MG tablet, Take 1 tablet (50 mg total) by mouth daily. Start with 0.5 tab (25mg ) PO q hs x 1 week, then increase to 1 tab PO q hs (Patient not taking: Reported on 12/25/2019), Disp: 90 tablet, Rfl: 0  Review of Systems  Constitutional: Positive for chills, fatigue  and fever.  HENT: Positive for congestion, postnasal drip and rhinorrhea. Negative for ear pain, sore throat and trouble swallowing.   Eyes: Negative for visual disturbance.  Respiratory: Positive for cough. Negative for chest tightness, shortness of breath and wheezing.   Cardiovascular: Negative for chest pain, palpitations and leg swelling.  Gastrointestinal: Positive for nausea. Negative for abdominal pain and vomiting.  Neurological: Positive for headaches.    Social History   Tobacco Use  . Smoking status: Never Smoker  . Smokeless tobacco: Never Used  Substance Use Topics  . Alcohol use: Yes    Alcohol/week: 0.0 standard drinks      Objective:   Pulse 98   Temp 99.5 F (37.5 C) (Oral)   SpO2 93%  Vitals:   12/25/19 1157  Pulse: 98  Temp: 99.5 F (37.5 C)  TempSrc: Oral  SpO2: 93%  There is no height or weight on file to calculate BMI.   Physical Exam Vitals reviewed.  Constitutional:      General: He is not in acute distress. Pulmonary:     Effort: No respiratory distress (patient was able to talk in full sentences during conversation; had occasional dry cough).  Neurological:     Mental Status: He is alert.     No results found for any visits on 12/25/19.     Assessment & Plan     1. COVID-19 Continue using  IBU and tylenol alternating between the two medications every 3 hours. Continue Albuterol inhaler q 4-6 hrs. Tessalon perles prn for cough. Use Mucinex for congestion more regularly. Zofran provided for nausea. Push fluids. Try to eat small bites more frequently. Rest but get up and walk short distances every 2-3 hours. Information about covid and supplements to help build immune system sent to patient through mychart. Will enroll in MyChart monitoring. Strict return precautions or reasons to go to ER or UC were discussed verbally with patient. Call if worsening.  - MyChart COVID-19 home monitoring program; Future - Temperature monitoring; Future  2.  Nausea See above medical treatment plan. - ondansetron (ZOFRAN) 4 MG tablet; Take 1 tablet (4 mg total) by mouth every 8 (eight) hours as needed.  Dispense: 20 tablet; Refill: 0   I discussed the assessment and treatment plan with the patient. The patient was provided an opportunity to ask questions and all were answered. The patient agreed with the plan and demonstrated an understanding of the instructions.   The patient was advised to call back or seek an in-person evaluation if the symptoms worsen or if the condition fails to improve as anticipated.  I provided 12 minutes of non-face-to-face time during this encounter.    Mar Daring, PA-C  [facility]

## 2019-12-25 NOTE — Telephone Encounter (Signed)
Patient scheduled.

## 2019-12-25 NOTE — Patient Instructions (Signed)
Can take to lessen severity: Vit C 500mg twice daily Quercertin 250-500mg twice daily Zinc 75-100mg daily Melatonin 3-6 mg at bedtime Vit D3 1000-2000 IU daily Aspirin 81 mg daily with food Optional: Famotidine 20mg daily Also can add tylenol/ibuprofen as needed for fevers and body aches May add Mucinex or Mucinex DM as needed for cough/congestion  COVID-19 COVID-19 is a respiratory infection that is caused by a virus called severe acute respiratory syndrome coronavirus 2 (SARS-CoV-2). The disease is also known as coronavirus disease or novel coronavirus. In some people, the virus may not cause any symptoms. In others, it may cause a serious infection. The infection can get worse quickly and can lead to complications, such as:  Pneumonia, or infection of the lungs.  Acute respiratory distress syndrome or ARDS. This is a condition in which fluid build-up in the lungs prevents the lungs from filling with air and passing oxygen into the blood.  Acute respiratory failure. This is a condition in which there is not enough oxygen passing from the lungs to the body or when carbon dioxide is not passing from the lungs out of the body.  Sepsis or septic shock. This is a serious bodily reaction to an infection.  Blood clotting problems.  Secondary infections due to bacteria or fungus.  Organ failure. This is when your body's organs stop working. The virus that causes COVID-19 is contagious. This means that it can spread from person to person through droplets from coughs and sneezes (respiratory secretions). What are the causes? This illness is caused by a virus. You may catch the virus by:  Breathing in droplets from an infected person. Droplets can be spread by a person breathing, speaking, singing, coughing, or sneezing.  Touching something, like a table or a doorknob, that was exposed to the virus (contaminated) and then touching your mouth, nose, or eyes. What increases the risk? Risk for  infection You are more likely to be infected with this virus if you:  Are within 6 feet (2 meters) of a person with COVID-19.  Provide care for or live with a person who is infected with COVID-19.  Spend time in crowded indoor spaces or live in shared housing. Risk for serious illness You are more likely to become seriously ill from the virus if you:  Are 50 years of age or older. The higher your age, the more you are at risk for serious illness.  Live in a nursing home or long-term care facility.  Have cancer.  Have a long-term (chronic) disease such as: ? Chronic lung disease, including chronic obstructive pulmonary disease or asthma. ? A long-term disease that lowers your body's ability to fight infection (immunocompromised). ? Heart disease, including heart failure, a condition in which the arteries that lead to the heart become narrow or blocked (coronary artery disease), a disease which makes the heart muscle thick, weak, or stiff (cardiomyopathy). ? Diabetes. ? Chronic kidney disease. ? Sickle cell disease, a condition in which red blood cells have an abnormal "sickle" shape. ? Liver disease.  Are obese. What are the signs or symptoms? Symptoms of this condition can range from mild to severe. Symptoms may appear any time from 2 to 14 days after being exposed to the virus. They include:  A fever or chills.  A cough.  Difficulty breathing.  Headaches, body aches, or muscle aches.  Runny or stuffy (congested) nose.  A sore throat.  New loss of taste or smell. Some people may also have stomach problems,   such as nausea, vomiting, or diarrhea. Other people may not have any symptoms of COVID-19. How is this diagnosed? This condition may be diagnosed based on:  Your signs and symptoms, especially if: ? You live in an area with a COVID-19 outbreak. ? You recently traveled to or from an area where the virus is common. ? You provide care for or live with a person who  was diagnosed with COVID-19. ? You were exposed to a person who was diagnosed with COVID-19.  A physical exam.  Lab tests, which may include: ? Taking a sample of fluid from the back of your nose and throat (nasopharyngeal fluid), your nose, or your throat using a swab. ? A sample of mucus from your lungs (sputum). ? Blood tests.  Imaging tests, which may include, X-rays, CT scan, or ultrasound. How is this treated? At present, there is no medicine to treat COVID-19. Medicines that treat other diseases are being used on a trial basis to see if they are effective against COVID-19. Your health care provider will talk with you about ways to treat your symptoms. For most people, the infection is mild and can be managed at home with rest, fluids, and over-the-counter medicines. Treatment for a serious infection usually takes places in a hospital intensive care unit (ICU). It may include one or more of the following treatments. These treatments are given until your symptoms improve.  Receiving fluids and medicines through an IV.  Supplemental oxygen. Extra oxygen is given through a tube in the nose, a face mask, or a hood.  Positioning you to lie on your stomach (prone position). This makes it easier for oxygen to get into the lungs.  Continuous positive airway pressure (CPAP) or bi-level positive airway pressure (BPAP) machine. This treatment uses mild air pressure to keep the airways open. A tube that is connected to a motor delivers oxygen to the body.  Ventilator. This treatment moves air into and out of the lungs by using a tube that is placed in your windpipe.  Tracheostomy. This is a procedure to create a hole in the neck so that a breathing tube can be inserted.  Extracorporeal membrane oxygenation (ECMO). This procedure gives the lungs a chance to recover by taking over the functions of the heart and lungs. It supplies oxygen to the body and removes carbon dioxide. Follow these  instructions at home: Lifestyle  If you are sick, stay home except to get medical care. Your health care provider will tell you how long to stay home. Call your health care provider before you go for medical care.  Rest at home as told by your health care provider.  Do not use any products that contain nicotine or tobacco, such as cigarettes, e-cigarettes, and chewing tobacco. If you need help quitting, ask your health care provider.  Return to your normal activities as told by your health care provider. Ask your health care provider what activities are safe for you. General instructions  Take over-the-counter and prescription medicines only as told by your health care provider.  Drink enough fluid to keep your urine pale yellow.  Keep all follow-up visits as told by your health care provider. This is important. How is this prevented?  There is no vaccine to help prevent COVID-19 infection. However, there are steps you can take to protect yourself and others from this virus. To protect yourself:   Do not travel to areas where COVID-19 is a risk. The areas where COVID-19 is reported change   often. To identify high-risk areas and travel restrictions, check the CDC travel website: wwwnc.cdc.gov/travel/notices  If you live in, or must travel to, an area where COVID-19 is a risk, take precautions to avoid infection. ? Stay away from people who are sick. ? Wash your hands often with soap and water for 20 seconds. If soap and water are not available, use an alcohol-based hand sanitizer. ? Avoid touching your mouth, face, eyes, or nose. ? Avoid going out in public, follow guidance from your state and local health authorities. ? If you must go out in public, wear a cloth face covering or face mask. Make sure your mask covers your nose and mouth. ? Avoid crowded indoor spaces. Stay at least 6 feet (2 meters) away from others. ? Disinfect objects and surfaces that are frequently touched every day.  This may include:  Counters and tables.  Doorknobs and light switches.  Sinks and faucets.  Electronics, such as phones, remote controls, keyboards, computers, and tablets. To protect others: If you have symptoms of COVID-19, take steps to prevent the virus from spreading to others.  If you think you have a COVID-19 infection, contact your health care provider right away. Tell your health care team that you think you may have a COVID-19 infection.  Stay home. Leave your house only to seek medical care. Do not use public transport.  Do not travel while you are sick.  Wash your hands often with soap and water for 20 seconds. If soap and water are not available, use alcohol-based hand sanitizer.  Stay away from other members of your household. Let healthy household members care for children and pets, if possible. If you have to care for children or pets, wash your hands often and wear a mask. If possible, stay in your own room, separate from others. Use a different bathroom.  Make sure that all people in your household wash their hands well and often.  Cough or sneeze into a tissue or your sleeve or elbow. Do not cough or sneeze into your hand or into the air.  Wear a cloth face covering or face mask. Make sure your mask covers your nose and mouth. Where to find more information  Centers for Disease Control and Prevention: www.cdc.gov/coronavirus/2019-ncov/index.html  World Health Organization: www.who.int/health-topics/coronavirus Contact a health care provider if:  You live in or have traveled to an area where COVID-19 is a risk and you have symptoms of the infection.  You have had contact with someone who has COVID-19 and you have symptoms of the infection. Get help right away if:  You have trouble breathing.  You have pain or pressure in your chest.  You have confusion.  You have bluish lips and fingernails.  You have difficulty waking from sleep.  You have symptoms  that get worse. These symptoms may represent a serious problem that is an emergency. Do not wait to see if the symptoms will go away. Get medical help right away. Call your local emergency services (911 in the [place] Do not drive yourself to the hospital. Let the emergency medical personnel know if you think you have COVID-19. Summary  COVID-19 is a respiratory infection that is caused by a virus. It is also known as coronavirus disease or novel coronavirus. It can cause serious infections, such as pneumonia, acute respiratory distress syndrome, acute respiratory failure, or sepsis.  The virus that causes COVID-19 is contagious. This means that it can spread from person to person through droplets from breathing, speaking,   singing, coughing, or sneezing.  You are more likely to develop a serious illness if you are 50 years of age or older, have a weak immune system, live in a nursing home, or have chronic disease.  There is no medicine to treat COVID-19. Your health care provider will talk with you about ways to treat your symptoms.  Take steps to protect yourself and others from infection. Wash your hands often and disinfect objects and surfaces that are frequently touched every day. Stay away from people who are sick and wear a mask if you are sick. This information is not intended to replace advice given to you by your health care provider. Make sure you discuss any questions you have with your health care provider. Document Revised: 09/01/2019 Document Reviewed: 12/08/2018 Elsevier Patient Education  2020 [employer] Inc[employer]  

## 2019-12-25 NOTE — Telephone Encounter (Signed)
Copied from CRM [id number]. Topic: General - Other >> Dec 25, 2019 11:42 AM Dalphine Handing A wrote: Patient said that he was diagnosed with covid and he still has a fever and nausea. Patient would like to speak with nurse in regards to symptoms and what Rosezetta Schlatter would advise.

## 2019-12-27 ENCOUNTER — Ambulatory Visit: Payer: Self-pay | Admitting: *Deleted

## 2019-12-27 NOTE — Telephone Encounter (Signed)
Per initial encounter, "Patient calling with complaints of 100.7 temp this morning with abdominal cramping. Patient states temp was 102.7 last night with nausea and vomiting. Patient is covid + and is requesting to speak with RN regarding symptoms."; contacted th pt; he states his sats are good; tier of mild symptoms reviewed; pt encouraged to rest; stay hydrated; take medications as discussed in his visit on 12/25/19; and to continue to monitor he verbalized understanding; the pt sees Joycelyn Man, [facility]; he verbalized understanding.   Reason for Disposition . General information question, no triage required and triager able to answer question  Answer Assessment - Initial Assessment Questions 1. REASON FOR CALL or QUESTION: "What is your reason for calling today?" or "How can I best help you?" or "What question do you have that I can help answer?"     Management of COVID symptoms  Protocols used: INFORMATION ONLY CALL - NO TRIAGE-A-AH

## 2020-10-23 NOTE — Progress Notes (Signed)
Complete physical exam   Patient: Erik [name]   DOB: [birth year]   64 y.o. Male  MRN: [id number] Visit Date: 10/25/2020  Today's healthcare provider: Margaretann Loveless, PA-C   Chief Complaint  Patient presents with  . Annual Exam   Subjective    Erik [name] is a 64 y.o. male who presents today for a complete physical exam.  He reports consuming a general diet. The patient has a physically strenuous job, but has no regular exercise apart from work.  He generally feels well. He reports sleeping well. He does not have additional problems to discuss today.  HPI    History reviewed. No pertinent past medical history. Past Surgical History:  Procedure Laterality Date  . HERNIA REPAIR    . TONSILLECTOMY     Social History   Socioeconomic History  . Marital status: Married    Spouse name: Not on file  . Number of children: Not on file  . Years of education: Not on file  . Highest education level: Not on file  Occupational History  . Not on file  Tobacco Use  . Smoking status: Never Smoker  . Smokeless tobacco: Never Used  Vaping Use  . Vaping Use: Never used  Substance and Sexual Activity  . Alcohol use: Yes    Alcohol/week: 0.0 standard drinks  . Drug use: Never  . Sexual activity: Not on file  Other Topics Concern  . Not on file  Social History Narrative  . Not on file   Social Determinants of Health   Financial Resource Strain: Not on file  Food Insecurity: Not on file  Transportation Needs: Not on file  Physical Activity: Not on file  Stress: Not on file  Social Connections: Not on file  Intimate Partner Violence: Not on file   Family Status  Relation Name Status  . Mother  Deceased  . Father  Alive  . Sister  Alive  . Brother  Alive   Family History  Problem Relation Age of Onset  . Cancer Mother        Breast  . Breast cancer Mother   . Cancer Father        Esophagus  . Hypertension Sister   . Supraventricular tachycardia Sister   .  Breast cancer Sister   . Hypertension Brother    No Known Allergies  Patient Care Team: Reine Just as PCP - General (Family Medicine)   Medications: Outpatient Medications Prior to Visit  Medication Sig  . Multiple Vitamin (MULTIVITAMIN WITH MINERALS) TABS tablet Take 1 tablet by mouth daily.  . naproxen sodium (ALEVE) 220 MG tablet Take 220 mg by mouth daily as needed.   . ondansetron (ZOFRAN) 4 MG tablet Take 1 tablet (4 mg total) by mouth every 8 (eight) hours as needed.  . cyclobenzaprine (FLEXERIL) 5 MG tablet Take 1 tablet (5 mg total) by mouth 3 (three) times daily as needed. (Patient not taking: Reported on 12/25/2019)  . mometasone (ASMANEX) 220 MCG/INH inhaler Inhale 1 puff into the lungs daily. (Patient not taking: Reported on 08/07/2019)  . sertraline (ZOLOFT) 50 MG tablet Take 1 tablet (50 mg total) by mouth daily. Start with 0.5 tab (25mg ) PO q hs x 1 week, then increase to 1 tab PO q hs (Patient not taking: Reported on 12/25/2019)   No facility-administered medications prior to visit.    Review of Systems  Constitutional: Negative.   HENT: Negative.   Eyes: Negative.  Respiratory: Negative.   Cardiovascular: Negative.   Gastrointestinal: Negative.   Endocrine: Negative.   Genitourinary: Negative.   Musculoskeletal: Positive for arthralgias and back pain.  Skin: Negative.   Allergic/Immunologic: Negative.   Neurological: Negative.   Hematological: Negative.   Psychiatric/Behavioral: Negative.     Last CBC Lab Results  Component Value Date   WBC 6.6 10/25/2020   HGB 15.3 10/25/2020   HCT 45.9 10/25/2020   MCV 91 10/25/2020   MCH 30.4 10/25/2020   RDW 12.7 10/25/2020   PLT 247 10/25/2020   Last metabolic panel Lab Results  Component Value Date   GLUCOSE 114 (H) 10/25/2020   NA 141 10/25/2020   K 4.7 10/25/2020   CL 104 10/25/2020   CO2 20 10/25/2020   BUN 19 10/25/2020   CREATININE 1.17 10/25/2020   GFRNONAA 66 10/25/2020   GFRAA 77  10/25/2020   CALCIUM 9.2 10/25/2020   PROT 7.3 10/25/2020   ALBUMIN 4.5 10/25/2020   LABGLOB 2.8 10/25/2020   AGRATIO 1.6 10/25/2020   BILITOT 0.5 10/25/2020   ALKPHOS 81 10/25/2020   AST 30 10/25/2020   ALT 43 10/25/2020   ANIONGAP 10 07/10/2019      Objective    BP 136/82 (BP Location: Left Arm, Patient Position: Sitting, Cuff Size: Large)   Pulse 77   Temp 98.4 F (36.9 C) (Oral)   Resp 16   Ht 5\' 10"  (1.778 m)   Wt 197 lb 6.4 oz (89.5 kg)   BMI 28.32 kg/m  BP Readings from Last 3 Encounters:  10/25/20 136/82  11/25/19 (!) 148/87  07/14/19 120/70   Wt Readings from Last 3 Encounters:  10/25/20 197 lb 6.4 oz (89.5 kg)  11/25/19 198 lb (89.8 kg)  08/07/19 190 lb (86.2 kg)      Physical Exam Vitals reviewed.  Constitutional:      General: He is not in acute distress.    Appearance: Normal appearance. He is well-groomed and overweight. He is not ill-appearing.  HENT:     Head: Normocephalic and atraumatic.     Right Ear: Tympanic membrane and ear canal normal.     Left Ear: Tympanic membrane and ear canal normal.  Eyes:     General: No scleral icterus.       Right eye: No discharge.        Left eye: No discharge.     Extraocular Movements: Extraocular movements intact.     Conjunctiva/sclera: Conjunctivae normal.     Pupils: Pupils are equal, round, and reactive to light.  Neck:     Vascular: No carotid bruit.  Cardiovascular:     Rate and Rhythm: Normal rate and regular rhythm.     Pulses: Normal pulses.     Heart sounds: Normal heart sounds. No murmur heard.   Pulmonary:     Effort: Pulmonary effort is normal.     Breath sounds: Normal breath sounds.  Abdominal:     General: Abdomen is flat. Bowel sounds are normal.     Palpations: Abdomen is soft. There is no mass.     Tenderness: There is no abdominal tenderness.  Musculoskeletal:        General: Normal range of motion.     Right shoulder: Normal.     Left shoulder: Tenderness (over long head  biceps tendon) present. No swelling, deformity or bony tenderness. Normal range of motion. Normal strength. Normal pulse.     Cervical back: Normal range of motion and neck supple. No tenderness.  Right hip: Normal.     Left hip: Bony tenderness (over left greater trochanter) present. No tenderness or crepitus. Normal range of motion. Normal strength.     Right lower leg: No edema.     Left lower leg: No edema.  Lymphadenopathy:     Cervical: No cervical adenopathy.  Skin:    General: Skin is warm and dry.     Capillary Refill: Capillary refill takes less than 2 seconds.  Neurological:     General: No focal deficit present.     Mental Status: He is alert and oriented to person, place, and time. Mental status is at baseline.  Psychiatric:        Mood and Affect: Mood normal.        Behavior: Behavior normal. Behavior is cooperative.      Last depression screening scores PHQ 2/9 Scores 10/25/2020 06/08/2019 02/25/2018  PHQ - 2 Score 1 2 2   PHQ- 9 Score 5 7 7    Last fall risk screening Fall Risk  06/08/2019  Falls in the past year? 0  Follow up Falls evaluation completed   Last Audit-C alcohol use screening Alcohol Use Disorder Test (AUDIT) 10/25/2020  1. How often do you have a drink containing alcohol? 2  2. How many drinks containing alcohol do you have on a typical day when you are drinking? 0  3. How often do you have six or more drinks on one occasion? 0  AUDIT-C Score 2  Alcohol Brief Interventions/Follow-up AUDIT Score <7 follow-up not indicated   A score of 3 or more in women, and 4 or more in men indicates increased risk for alcohol abuse, EXCEPT if all of the points are from question 1   No results found for any visits on 10/25/20.  Assessment & Plan    Routine Health Maintenance and Physical Exam  Exercise Activities and Dietary recommendations Goals   None     Immunization History  Administered Date(s) Administered  . Influenza,inj,Quad PF,6+ Mos  08/17/2019  . Influenza-Unspecified 08/16/2014, 10/17/2015, 10/06/2020  . Tdap 11/16/2009    Health Maintenance  Topic Date Due  . COVID-19 Vaccine (1) Never done  . TETANUS/TDAP  11/17/2019  . COLONOSCOPY  10/23/2024  . INFLUENZA VACCINE  Completed  . Hepatitis C Screening  Completed  . HIV Screening  Completed    Discussed health benefits of physical activity, and encouraged him to engage in regular exercise appropriate for his age and condition.  1. Annual physical exam Normal physical exam today. Will check labs as below and f/u pending lab results. If labs are stable and WNL he will not need to have these rechecked for one year at his next annual physical exam. He is to call the office in the meantime if he has any acute issue, questions or concerns. - CBC with Differential/Platelet - Comprehensive metabolic panel - Hemoglobin A1c - Lipid panel - TSH  2. Colon cancer screening Due for colonoscopy. - Ambulatory referral to Gastroenterology  3. BMI 28.0-28.9,adult Counseled patient on healthy lifestyle modifications including dieting and exercise. Will check labs as below and f/u pending results. - CBC with Differential/Platelet - Comprehensive metabolic panel - Hemoglobin A1c - Lipid panel - TSH  4. OSA on CPAP Stable. Using nightly. Has improvement in previous symptoms.   5. Borderline diabetes Diet controlled. Will check labs as below and f/u pending results. - Comprehensive metabolic panel - Hemoglobin A1c - Lipid panel  6. Prostate cancer screening Will check labs as below  and f/u pending results. - PSA  7. Biceps tendinitis of left upper extremity Tender to palpation over the left biceps long head tendon with radiating symptoms into muscle belly. Will try medrol as below. Ice after activity. Call if worsening.  - methylPREDNISolone (MEDROL) 4 MG TBPK tablet; 6 day taper; take as directed on package instructions  Dispense: 21 tablet; Refill: 0  8.  Trochanteric bursitis of left hip Tender to palpation over the left greater trochanter. Suspect bursitis. Medrol given as below. Call if worsening.  - methylPREDNISolone (MEDROL) 4 MG TBPK tablet; 6 day taper; take as directed on package instructions  Dispense: 21 tablet; Refill: 0  9. Low serum testosterone H/O this. Will check labs as below and f/u pending results. - Testosterone   No follow-ups on file.     Delmer Islam, PA-C, have reviewed all documentation for this visit. The documentation on 11/18/20 for the exam, diagnosis, procedures, and orders are all accurate and complete.   Reine Just  [facility] [phone] (phone) [phone] (fax)  [facility]

## 2020-10-25 ENCOUNTER — Other Ambulatory Visit: Payer: Self-pay

## 2020-10-25 ENCOUNTER — Ambulatory Visit (INDEPENDENT_AMBULATORY_CARE_PROVIDER_SITE_OTHER): Payer: 59 | Admitting: Physician Assistant

## 2020-10-25 ENCOUNTER — Encounter: Payer: Self-pay | Admitting: Physician Assistant

## 2020-10-25 VITALS — BP 136/82 | HR 77 | Temp 98.4°F | Resp 16 | Ht 70.0 in | Wt 197.4 lb

## 2020-10-25 DIAGNOSIS — Z0000 Encounter for general adult medical examination without abnormal findings: Secondary | ICD-10-CM

## 2020-10-25 DIAGNOSIS — R7989 Other specified abnormal findings of blood chemistry: Secondary | ICD-10-CM

## 2020-10-25 DIAGNOSIS — R7303 Prediabetes: Secondary | ICD-10-CM

## 2020-10-25 DIAGNOSIS — Z1211 Encounter for screening for malignant neoplasm of colon: Secondary | ICD-10-CM

## 2020-10-25 DIAGNOSIS — M7522 Bicipital tendinitis, left shoulder: Secondary | ICD-10-CM | POA: Diagnosis not present

## 2020-10-25 DIAGNOSIS — Z6828 Body mass index (BMI) 28.0-28.9, adult: Secondary | ICD-10-CM

## 2020-10-25 DIAGNOSIS — M7062 Trochanteric bursitis, left hip: Secondary | ICD-10-CM | POA: Diagnosis not present

## 2020-10-25 DIAGNOSIS — Z9989 Dependence on other enabling machines and devices: Secondary | ICD-10-CM

## 2020-10-25 DIAGNOSIS — G4733 Obstructive sleep apnea (adult) (pediatric): Secondary | ICD-10-CM

## 2020-10-25 DIAGNOSIS — Z125 Encounter for screening for malignant neoplasm of prostate: Secondary | ICD-10-CM

## 2020-10-25 MED ORDER — METHYLPREDNISOLONE 4 MG PO TBPK: ORAL_TABLET | ORAL | 0 refills | Status: DC

## 2020-10-25 NOTE — Patient Instructions (Signed)
Hip Bursitis Rehab Ask your health care provider which exercises are safe for you. Do exercises exactly as told by your health care provider and adjust them as directed. It is normal to feel mild stretching, pulling, tightness, or discomfort as you do these exercises. Stop right away if you feel sudden pain or your pain gets worse. Do not begin these exercises until told by your health care provider. Stretching exercise This exercise warms up your muscles and joints and improves the movement and flexibility of your hip. This exercise also helps to relieve pain and stiffness. Iliotibial band stretch An iliotibial band is a strong band of muscle tissue that runs from the outer side of your hip to the outer side of your thigh and knee. 1. Lie on your side with your left / right leg in the top position. 2. Bend your left / right knee and grab your ankle. Stretch out your bottom arm to help you balance. 3. Slowly bring your knee back so your thigh is behind your body. 4. Slowly lower your knee toward the floor until you feel a gentle stretch on the outside of your left / right thigh. If you do not feel a stretch and your knee will not fall farther, place the heel of your other foot on top of your knee and pull your knee down toward the floor with your foot. 5. Hold this position for __________ seconds. 6. Slowly return to the starting position. Repeat __________ times. Complete this exercise __________ times a day. Strengthening exercises These exercises build strength and endurance in your hip and pelvis. Endurance is the ability to use your muscles for a long time, even after they get tired. Bridge This exercise strengthens the muscles that move your thigh backward (hip extensors). 1. Lie on your back on a firm surface with your knees bent and your feet flat on the floor. 2. Tighten your buttocks muscles and lift your buttocks off the floor until your trunk is level with your thighs. ? Do not arch  your back. ? You should feel the muscles working in your buttocks and the back of your thighs. If you do not feel these muscles, slide your feet 1-2 inches (2.5-5 cm) farther away from your buttocks. ? If this exercise is too easy, try doing it with your arms crossed over your chest. 3. Hold this position for __________ seconds. 4. Slowly lower your hips to the starting position. 5. Let your muscles relax completely after each repetition. Repeat __________ times. Complete this exercise __________ times a day. Squats This exercise strengthens the muscles in front of your thigh and knee (quadriceps). 1. Stand in front of a table, with your feet and knees pointing straight ahead. You may rest your hands on the table for balance but not for support. 2. Slowly bend your knees and lower your hips like you are going to sit in a chair. ? Keep your weight over your heels, not over your toes. ? Keep your lower legs upright so they are parallel with the table legs. ? Do not let your hips go lower than your knees. ? Do not bend lower than told by your health care provider. ? If your hip pain increases, do not bend as low. 3. Hold the squat position for __________ seconds. 4. Slowly push with your legs to return to standing. Do not use your hands to pull yourself to standing. Repeat __________ times. Complete this exercise __________ times a day. Hip hike 1. Stand   sideways on a bottom step. Stand on your left / right leg with your other foot unsupported next to the step. You can hold on to the railing or wall for balance if needed. 2. Keep your knees straight and your torso square. Then lift your left / right hip up toward the ceiling. 3. Hold this position for __________ seconds. 4. Slowly let your left / right hip lower toward the floor, past the starting position. Your foot should get closer to the floor. Do not lean or bend your knees. Repeat __________ times. Complete this exercise __________ times a  day. Single leg stand 1. Without shoes, stand near a railing or in a doorway. You may hold on to the railing or door frame as needed for balance. 2. Squeeze your left / right buttock muscles, then lift up your other foot. ? Do not let your left / right hip push out to the side. ? It is helpful to stand in front of a mirror for this exercise so you can watch your hip. 3. Hold this position for __________ seconds. Repeat __________ times. Complete this exercise __________ times a day. This information is not intended to replace advice given to you by your health care provider. Make sure you discuss any questions you have with your health care provider. Document Revised: 02/27/2019 Document Reviewed: 02/27/2019 Elsevier Patient Education  2020 [employer]   Distal Biceps Tendinitis Rehab Ask your health care provider which exercises are safe for you. Do exercises exactly as told by your health care provider and adjust them as directed. It is normal to feel mild stretching, pulling, tightness, or discomfort as you do these exercises. Stop right away if you feel sudden pain or your pain gets worse. Do not begin these exercises until told by your health care provider. Stretching and range-of-motion exercises These exercises warm up your muscles and joints and improve the movement and flexibility of your arm. These exercises can also help to relieve pain and stiffness. Elbow range of motion 1. Stand or sit with your left / right elbow bent in a 90-degree angle (right angle). Position your forearm so that the thumb is facing the ceiling (neutral position). 2. Slowly straighten your elbow until you feel a stretch. 3. Hold this position for __________ seconds. 4. Slowly bend your elbow until you feel a stretch, or until you touch your thumb to your shoulder. 5. Hold this position for __________ seconds. Repeat __________ times. Complete this exercise __________ times a day. Forearm rotation,  supination 1. Stand up or sit with your left / right elbow bent in a 90-degree angle (right angle). 2. Rotate your palm up until you cannot rotate it anymore (supination). Then, use your other hand to help turn your left / right forearm more. 3. Hold this position for __________ seconds. 4. Slowly return to the starting position. Repeat __________ times. Complete this exercise __________ times a day. Forearm rotation, pronation 1. Stand up or sit with your left / right elbow bent in a 90-degree angle (right angle). 2. Turn (rotate) your left / right palm down (pronation) until you cannot rotate it anymore. Then, use your other hand to help turn your left / right forearm more. 3. Hold this position for __________ seconds. 4. Slowly return to the starting position. Repeat __________ times. Complete this exercise __________ times a day. Biceps stretch 1. Stand by a door frame. 2. Place your left / right hand on the door frame. Keep your elbow straight during  the exercise. Gently turn your body toward the opposite side until you feel a gentle stretch. 3. Hold this position for __________ seconds. 4. Slowly return to the starting position. Repeat __________ times. Complete this exercise __________ times a day. Strengthening exercises These exercises build strength and endurance in your arm and shoulder. Endurance is the ability to use your muscles for a long time, even after they get tired. Forearm rotation, supination  1. Sit with your left / right forearm supported on a table. Your elbow should be at waist height. 2. Gently grasp a lightweight hammer near the head. As this exercise gets easier for you, try holding the hammer farther down the handle. 3. Rest your hand over the edge of the table, palm down. 4. Without moving your left / right elbow, slowly rotate your palm up (supination), stopping when your thumb is pointed toward the ceiling. 5. Hold this position for__________  seconds. 6. Slowly return to the starting position. Repeat __________ times. Complete this exercise __________ times a day. Forearm rotation, pronation  1. Sit with your left / right forearm supported on a table. Your elbow should be at waist height. 2. Gently grasp a lightweight hammer near the head. As this exercise gets easier for you, try holding the hammer farther down the handle. 3. Rest your hand over the edge of the table, palm up. 4. Without moving your left / right elbow, slowly rotate your palm down (pronation), stopping when your thumb is pointed toward the floor. 5. Hold this position for __________ seconds. 6. Slowly return to the starting position. Repeat __________ times. Complete this exercise __________ times a day. Biceps curls  1. Sit on a stable chair without armrests, or stand up. 2. If directed, hold a __________ weight in your left / right hand, or hold an exercise band with both hands. Your palms should face up toward the ceiling at the starting position. 3. Bend your left / right elbow and move your hand up toward your shoulder. Keep your other arm straight down, in the starting position. 4. Slowly return to the starting position. Repeat __________ times. Complete this exercise __________ times a day. Elbow extension, supine  1. Lie on your back (supine position). 2. Hold a __________ weight in your left / right hand. 3. Bend your left / right elbow to a 90-degree angle (right angle) so the weight is in front of your face, over your chest, and your elbow is pointed up to the ceiling. 4. Straighten your elbow, raising your hand toward the ceiling (extension). Use your other hand to support your left / right upper arm and to keep it still. 5. Slowly return to the starting position. Repeat __________ times. Complete this exercise __________ times a day. This information is not intended to replace advice given to you by your health care provider. Make sure you discuss  any questions you have with your health care provider. Document Revised: 02/28/2019 Document Reviewed: 11/03/2018 Elsevier Patient Education  2020 [employer].

## 2020-10-26 LAB — HEMOGLOBIN A1C
Est. average glucose Bld gHb Est-mCnc: 123 mg/dL
Hgb A1c MFr Bld: 5.9 % — ABNORMAL HIGH (ref 4.8–5.6)

## 2020-10-26 LAB — CBC WITH DIFFERENTIAL/PLATELET
Basophils Absolute: 0.1 10*3/uL (ref 0.0–0.2)
Basos: 1 %
EOS (ABSOLUTE): 0.1 10*3/uL (ref 0.0–0.4)
Eos: 2 %
Hematocrit: 45.9 % (ref 37.5–51.0)
Hemoglobin: 15.3 g/dL (ref 13.0–17.7)
Immature Grans (Abs): 0 10*3/uL (ref 0.0–0.1)
Immature Granulocytes: 0 %
Lymphocytes Absolute: 1.1 10*3/uL (ref 0.7–3.1)
Lymphs: 16 %
MCH: 30.4 pg (ref 26.6–33.0)
MCHC: 33.3 g/dL (ref 31.5–35.7)
MCV: 91 fL (ref 79–97)
Monocytes Absolute: 0.7 10*3/uL (ref 0.1–0.9)
Monocytes: 10 %
Neutrophils Absolute: 4.6 10*3/uL (ref 1.4–7.0)
Neutrophils: 71 %
Platelets: 247 10*3/uL (ref 150–450)
RBC: 5.03 x10E6/uL (ref 4.14–5.80)
RDW: 12.7 % (ref 11.6–15.4)
WBC: 6.6 10*3/uL (ref 3.4–10.8)

## 2020-10-26 LAB — LIPID PANEL
Chol/HDL Ratio: 3.3 ratio (ref 0.0–5.0)
Cholesterol, Total: 169 mg/dL (ref 100–199)
HDL: 51 mg/dL (ref >39)
LDL Chol Calc (NIH): 101 mg/dL — ABNORMAL HIGH (ref 0–99)
Triglycerides: 95 mg/dL (ref 0–149)
VLDL Cholesterol Cal: 17 mg/dL (ref 5–40)

## 2020-10-26 LAB — COMPREHENSIVE METABOLIC PANEL
ALT: 43 IU/L (ref 0–44)
AST: 30 IU/L (ref 0–40)
Albumin/Globulin Ratio: 1.6 (ref 1.2–2.2)
Albumin: 4.5 g/dL (ref 3.8–4.8)
Alkaline Phosphatase: 81 IU/L (ref 44–121)
BUN/Creatinine Ratio: 16 (ref 10–24)
BUN: 19 mg/dL (ref 8–27)
Bilirubin Total: 0.5 mg/dL (ref 0.0–1.2)
CO2: 20 mmol/L (ref 20–29)
Calcium: 9.2 mg/dL (ref 8.6–10.2)
Chloride: 104 mmol/L (ref 96–106)
Creatinine, Ser: 1.17 mg/dL (ref 0.76–1.27)
GFR calc Af Amer: 77 mL/min/{1.73_m2} (ref >59)
GFR calc non Af Amer: 66 mL/min/{1.73_m2} (ref >59)
Globulin, Total: 2.8 g/dL (ref 1.5–4.5)
Glucose: 114 mg/dL — ABNORMAL HIGH (ref 65–99)
Potassium: 4.7 mmol/L (ref 3.5–5.2)
Sodium: 141 mmol/L (ref 134–144)
Total Protein: 7.3 g/dL (ref 6.0–8.5)

## 2020-10-26 LAB — PSA: Prostate Specific Ag, Serum: 0.8 ng/mL (ref 0.0–4.0)

## 2020-10-26 LAB — TESTOSTERONE: Testosterone: 388 ng/dL (ref 264–916)

## 2020-10-26 LAB — TSH: TSH: 2.34 u[IU]/mL (ref 0.450–4.500)

## 2020-11-04 IMAGING — CR CHEST - 2 VIEW
2 series · 2 of 2 positions shown · non-contrast
Comparison: None.

CLINICAL DATA: Shortness of breath.

EXAM:
CHEST - 2 VIEW

[chest pa]
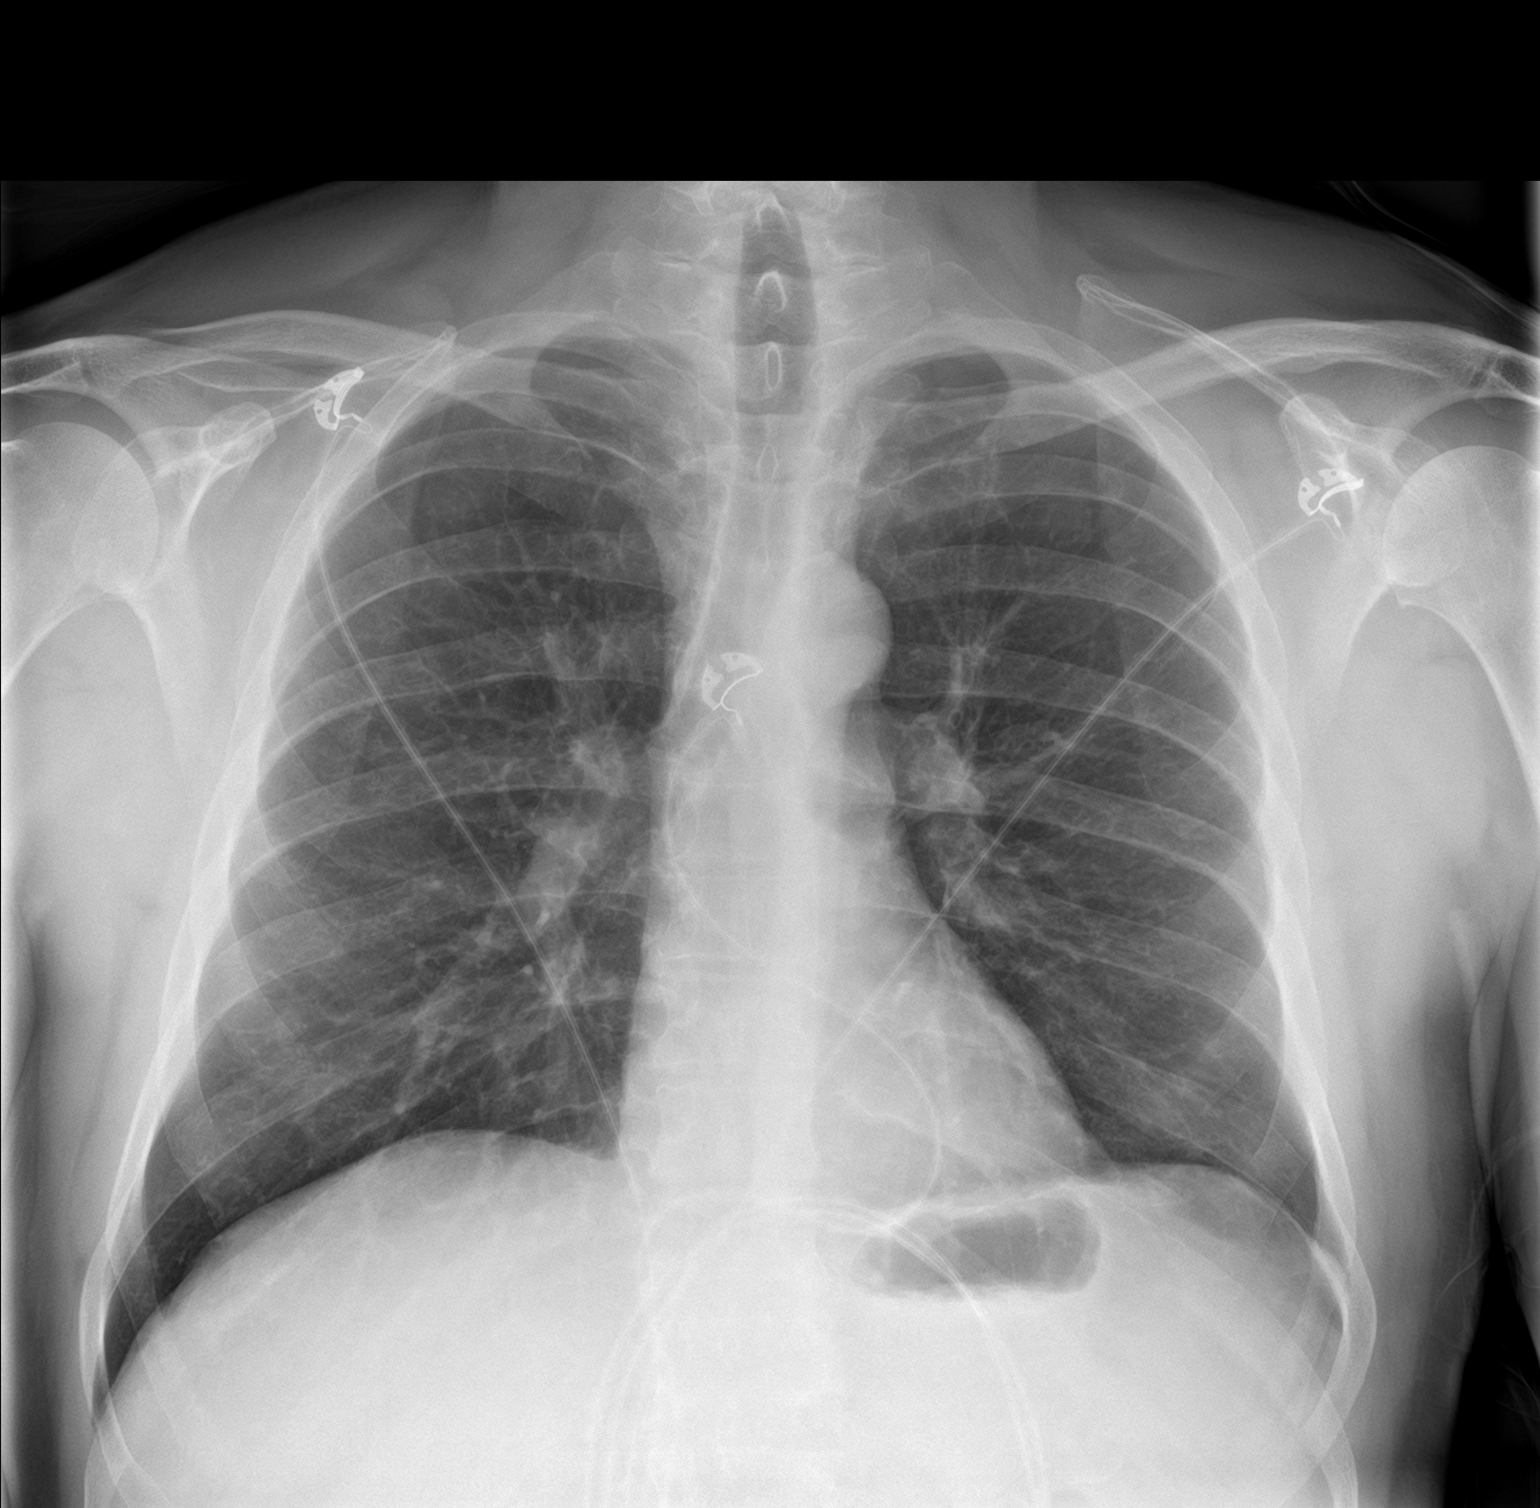

[chest lat]
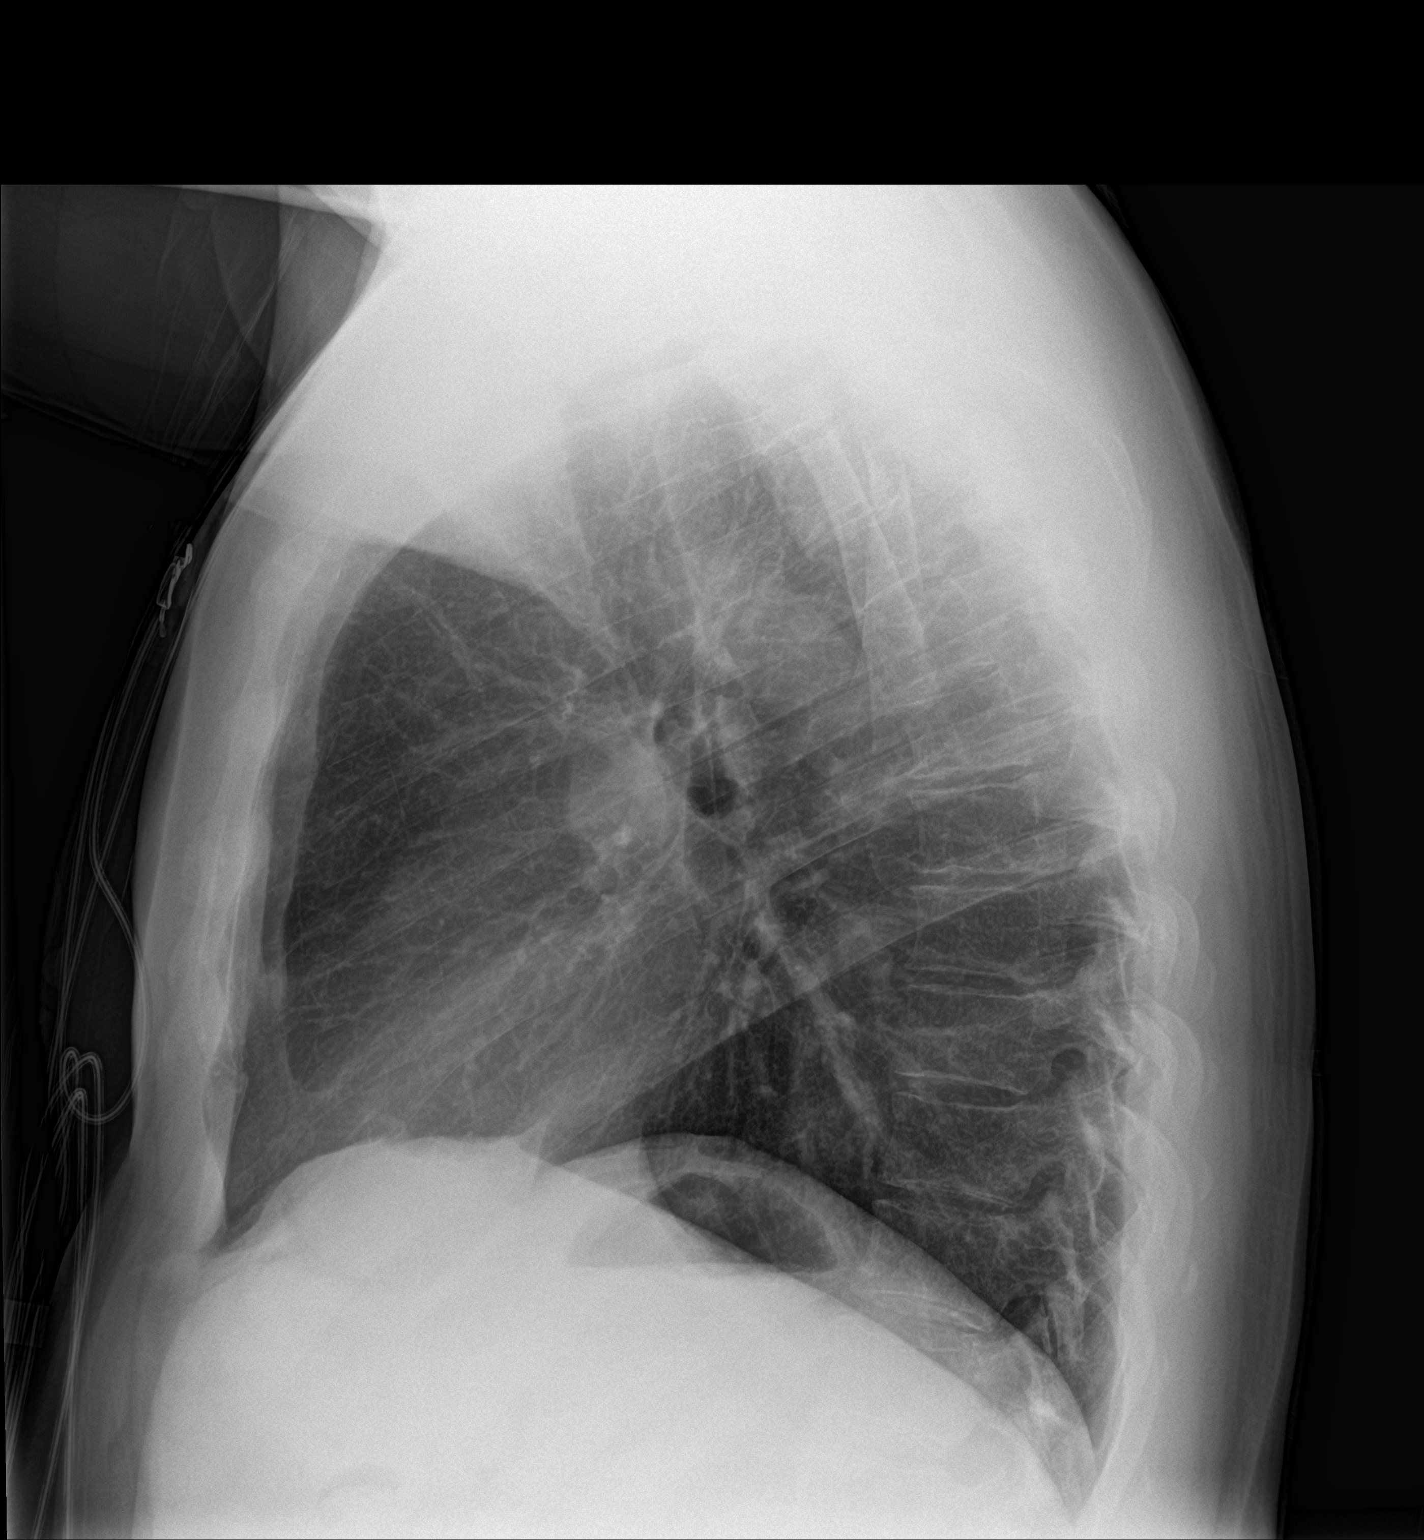

[2 of 2 positions shown; findings below may reference images not displayed]

FINDINGS: There is a 10 mm nodular density overlying the right lower lung
zone. The lungs are otherwise clear. Heart size and vascularity are
normal. No effusions. Bones are normal.
IMPRESSION: Nodular density at the right lung base. This may represent a nipple
shadow rather than a pulmonary nodule. I recommend a repeat PA chest
with nipple markers for initial further evaluation.

Otherwise, normal exam.

## 2020-11-08 IMAGING — CT CT ANGIOGRAPHY CHEST
1 series · 19 of 34 positions shown · IV contrast (omnipaque)
Comparison: Chest x-ray dated 07/10/2019 and CT scan of the abdomen
dated 07/02/2009

CLINICAL DATA: Shortness of breath. Possible nodule at the right
lung base.

EXAM:
CT ANGIOGRAPHY CHEST WITH CONTRAST
TECHNIQUE: Multidetector CT imaging of the chest was performed using the
standard protocol during bolus administration of intravenous
contrast. Multiplanar CT image reconstructions and MIPs were
obtained to evaluate the vascular anatomy.
CONTRAST:  75mL OMNIPAQUE IOHEXOL 350 MG/ML SOLN

[Series 5: cta pe · axial · 0.68mm/px · z∈[-1187,-887]mm · 19 of 164 slices shown]
[im 7/164  lung]
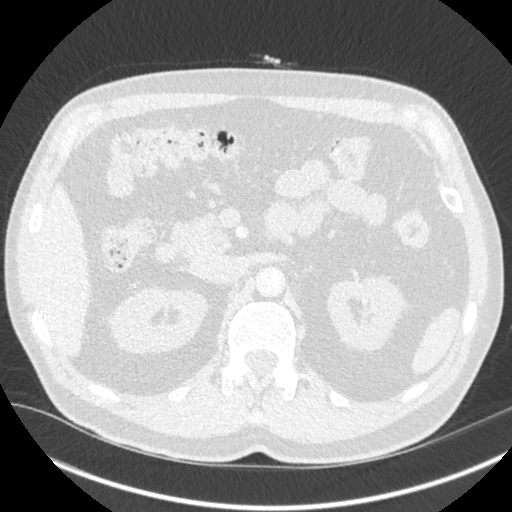
[im 19/164  mediastinal]
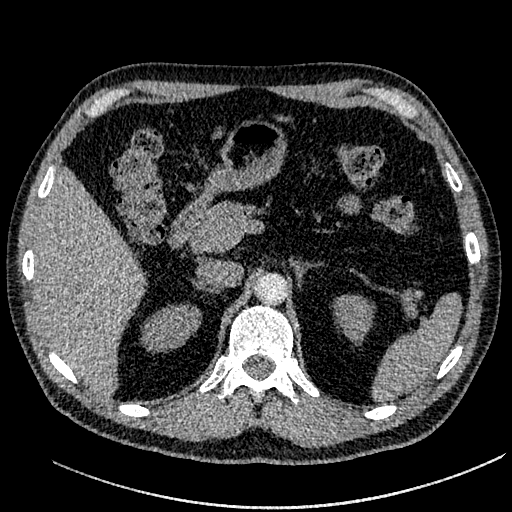
[im 31/164  lung]
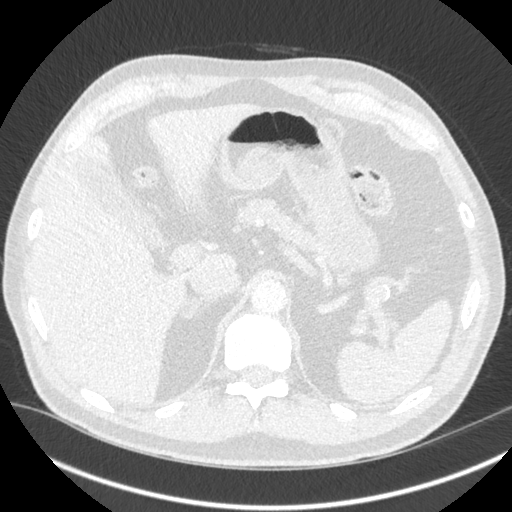
[im 33/164  mediastinal]
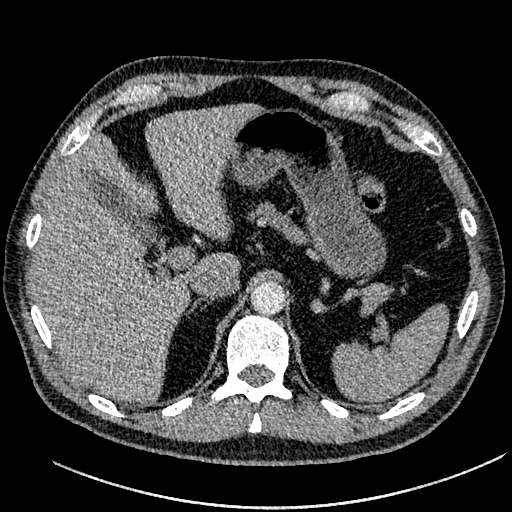
[im 43/164  lung]
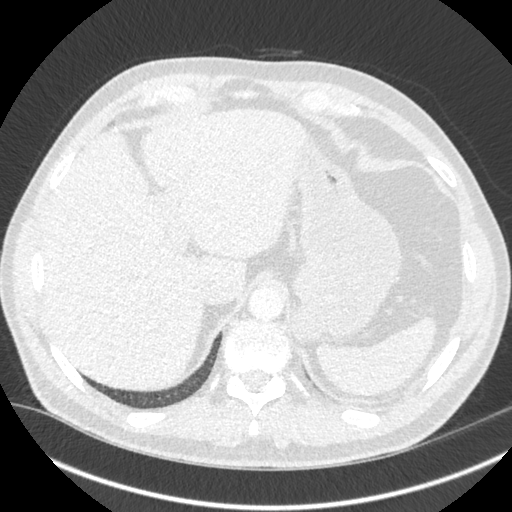
[im 55/164  mediastinal]
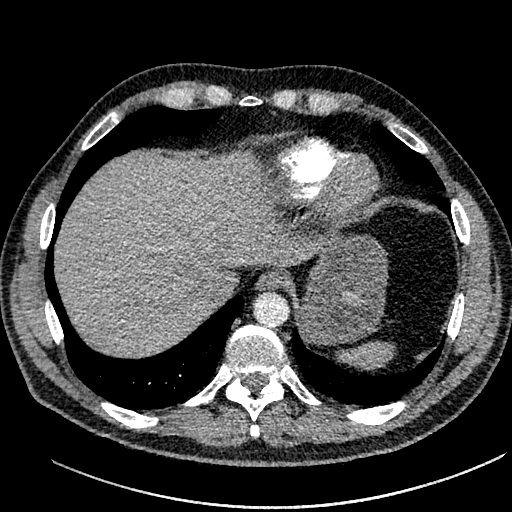
[im 61/164  lung]
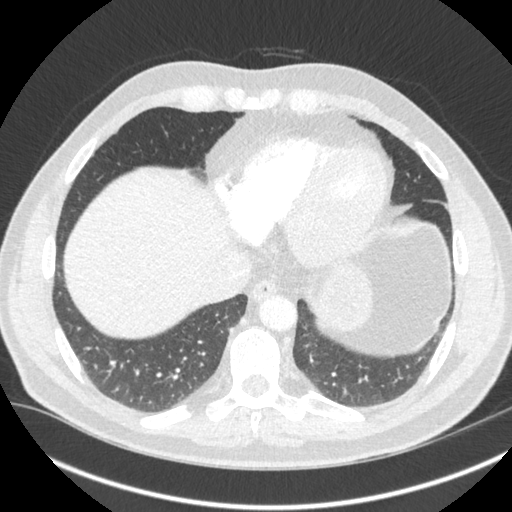
[im 67/164  mediastinal]
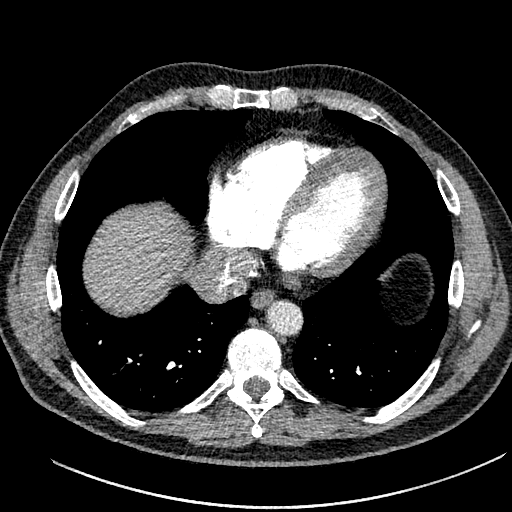
[im 78/164  lung]
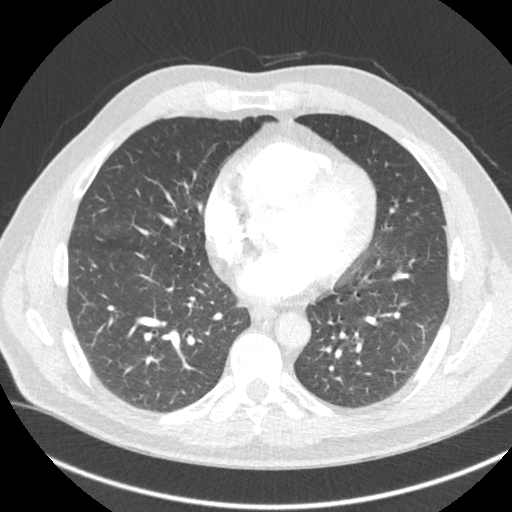
[im 85/164  mediastinal]
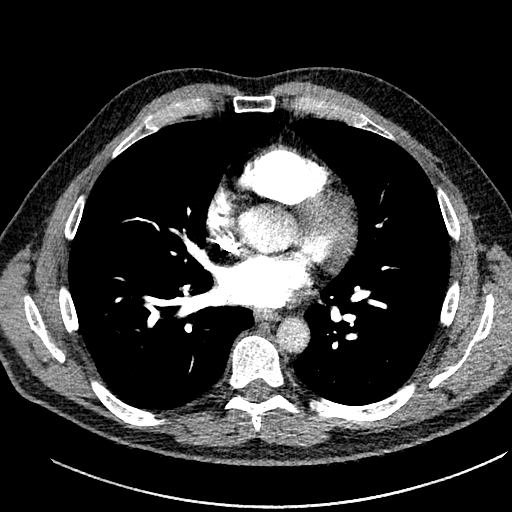
[im 87/164  lung]
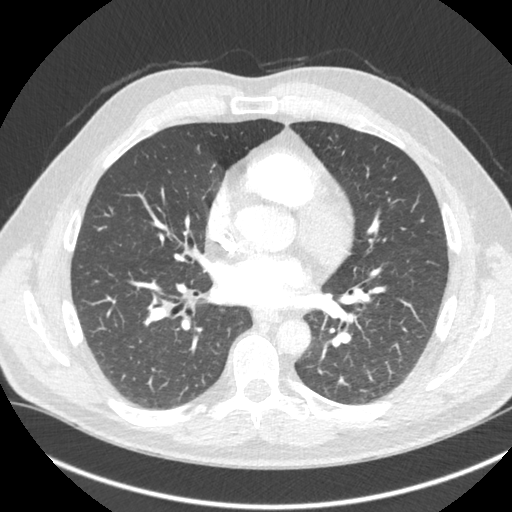
[im 97/164  mediastinal]
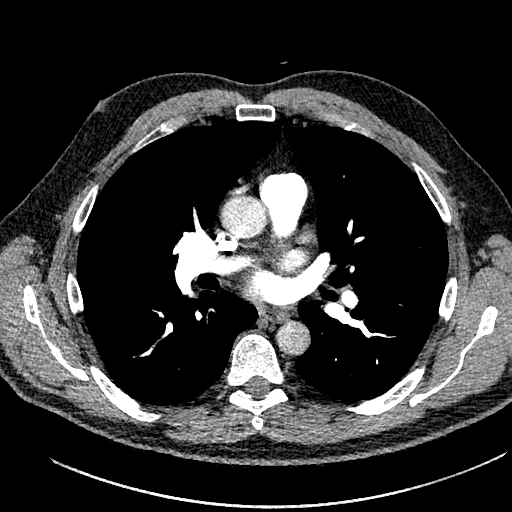
[im 103/164  lung]
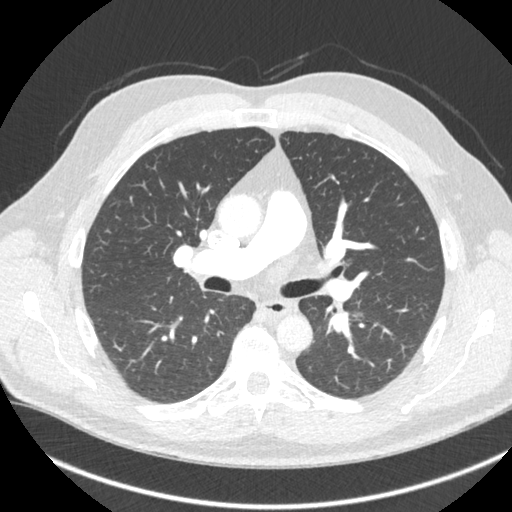
[im 109/164  mediastinal]
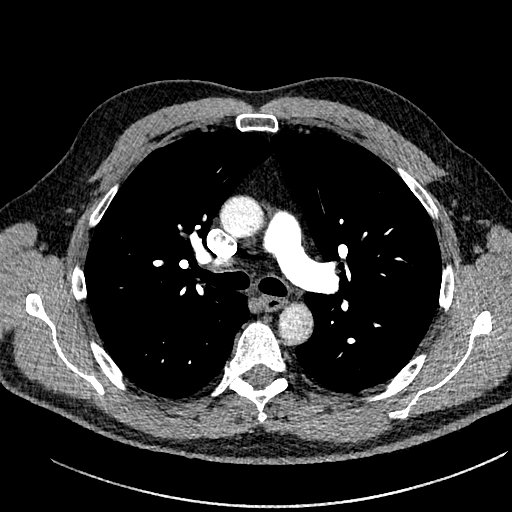
[im 121/164  lung]
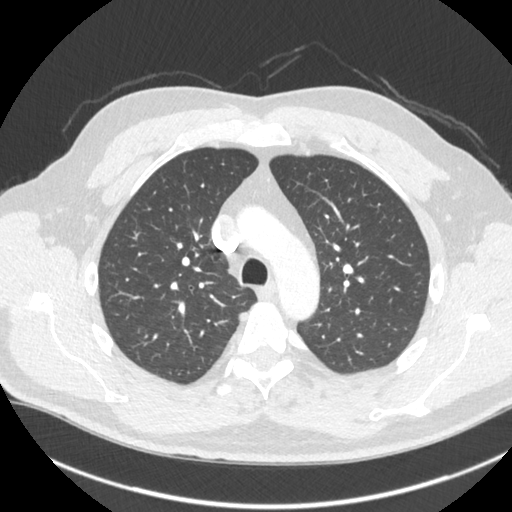
[im 131/164  mediastinal]
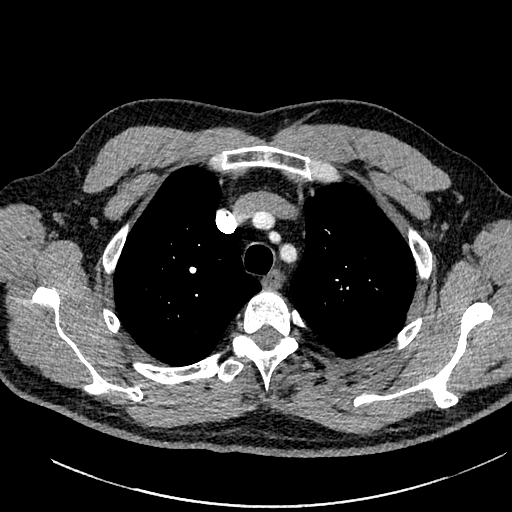
[im 133/164  lung]
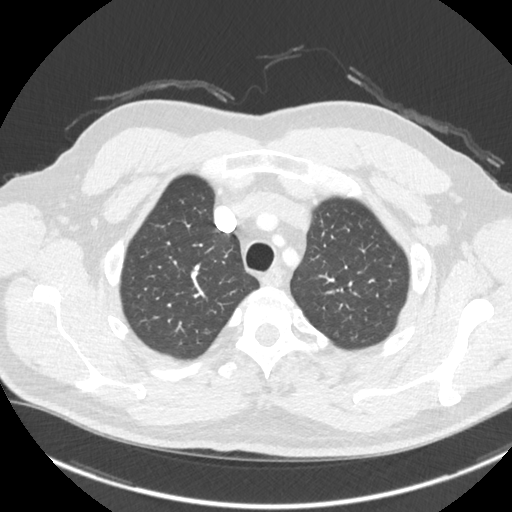
[im 145/164  mediastinal]
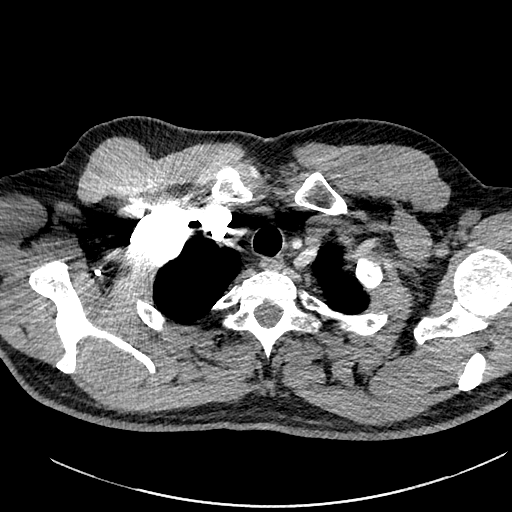
[im 157/164  lung]
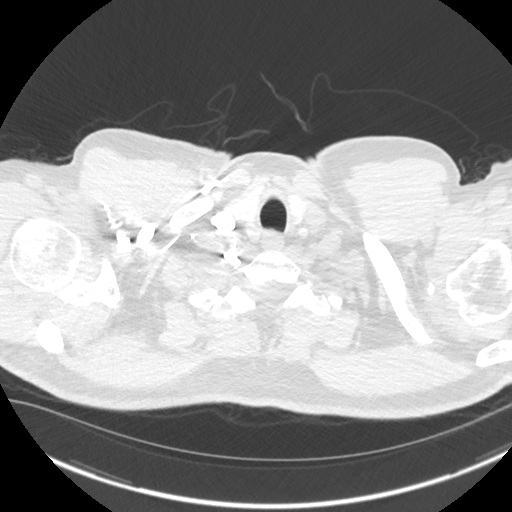

[19 of 34 positions shown; findings below may reference images not displayed]

FINDINGS: Cardiovascular: Satisfactory opacification of the pulmonary arteries
to the segmental level. No evidence of pulmonary embolism. Normal
heart size. No pericardial effusion.

Mediastinum/Nodes: No enlarged mediastinal, hilar, or axillary lymph
nodes. Thyroid gland, trachea, and esophagus demonstrate no
significant findings.

Lungs/Pleura: Lungs are clear. No pleural effusion or pneumothorax.
There is no evidence of a nodule in the right lower lobe. The
patient does have prominent nipples. The density seen on the prior
chest x-rays felt to have represented a right nipple shadow.

Upper Abdomen: Small benign cysts in the right and left lobes of the
liver as demonstrated on the prior CT scan of the abdomen. No acute
abnormalities. Partially calcified 14 mm splenic artery aneurysm.

Musculoskeletal: No chest wall abnormality. No acute or significant
osseous findings.

Review of the MIP images confirms the above findings.
IMPRESSION: 1. No pulmonary emboli or other acute abnormalities.
2. No pulmonary nodules. The density seen over the right lower lung
zone on prior chest x-ray represents a nipple shadow.
3. Partially calcified 14 mm splenic artery aneurysm. Annual
surveillance is recommended. Surgical literature suggests a
consensus that such an aneurysm should be considered for
endovascular therapy when ?2 cm. Surveillance intervals of >1 year
may be reasonable for the smaller aneurysms, depending on
comorbidities and life expectancy.

## 2020-11-16 DIAGNOSIS — G479 Sleep disorder, unspecified: Secondary | ICD-10-CM | POA: Insufficient documentation

## 2020-11-18 ENCOUNTER — Encounter: Payer: Self-pay | Admitting: Physician Assistant

## 2020-11-21 ENCOUNTER — Telehealth: Payer: Self-pay

## 2020-11-21 NOTE — Telephone Encounter (Signed)
Copied from CRM [id number]. Topic: General - Other >> Nov 21, 2020  9:42 AM Gwenlyn Fudge wrote: Reason for CRM: PT calling stating that he received a call from the nurse last week. Pt is requesting to have someone give him a call back. Please advise.

## 2020-11-22 NOTE — Telephone Encounter (Signed)
I haven't call. When I did called was for for results but I did talked to him. Closing encounter.

## 2021-10-27 ENCOUNTER — Encounter: Payer: 59 | Admitting: Physician Assistant

## 2022-04-02 NOTE — Progress Notes (Signed)
Complete physical exam   Patient: Erik [name]   DOB: 10-28-1956   66 y.o. Male  MRN: [id number] Visit Date: 04/03/2022  Today's healthcare provider: Alfredia FergusonLindsay Lasharon Dunivan, PA-C   I,Tiffany J Bragg,acting as a scribe for Alfredia FergusonLindsay Nadina Fomby, PA-C.,have documented all relevant documentation on the behalf of Alfredia FergusonLindsay Breanah Faddis, PA-C,as directed by  Alfredia FergusonLindsay Jaydn Moscato, PA-C while in the presence of Alfredia FergusonLindsay Hinley Brimage, PA-C.  Chief Complaint  Patient presents with   Annual Exam   Subjective    Erik EarthlyDwight E Bandel is a 66 y.o. male who presents today for a complete physical exam.  He reports consuming a general diet.  He reports he does not exercise.  He generally feels poorly. He reports sleeping poorly. He does have additional problems to discuss today.   Reports insomnia secondary to frequent urination/nocturia. Will get up to urinate 3-4 times a night.   Reports bilateral knee pain, worsening recently. Aching pain, stiffness. Uses CBD topically. Denies weakness, swelling.  Reports difficulty maintain/obtaining erection. Used a friend's Viagra w/ success.   History reviewed. No pertinent past medical history. Past Surgical History:  Procedure Laterality Date   HERNIA REPAIR     TONSILLECTOMY     Social History   Socioeconomic History   Marital status: Married    Spouse name: Not on file   Number of children: Not on file   Years of education: Not on file   Highest education level: Not on file  Occupational History   Not on file  Tobacco Use   Smoking status: Never   Smokeless tobacco: Never  Vaping Use   Vaping Use: Never used  Substance and Sexual Activity   Alcohol use: Yes    Alcohol/week: 0.0 standard drinks   Drug use: Never   Sexual activity: Not on file  Other Topics Concern   Not on file  Social History Narrative   Not on file   Social Determinants of Health   Financial Resource Strain: Not on file  Food Insecurity: Not on file  Transportation Needs: Not on file  Physical  Activity: Not on file  Stress: Not on file  Social Connections: Not on file  Intimate Partner Violence: Not on file   Family Status  Relation Name Status   Mother  Deceased   Father  Alive   Sister  Alive   Brother  Alive   Family History  Problem Relation Age of Onset   Cancer Mother        Breast   Breast cancer Mother    Cancer Father        Esophagus   Hypertension Sister    Supraventricular tachycardia Sister    Breast cancer Sister    Hypertension Brother    No Known Allergies  Patient Care Team: Alfredia Fergusonrubel, Cruz Bong, PA-C as PCP - General (Physician Assistant)   Medications: Outpatient Medications Prior to Visit  Medication Sig   Multiple Vitamin (MULTIVITAMIN WITH MINERALS) TABS tablet Take 1 tablet by mouth daily.   naproxen sodium (ALEVE) 220 MG tablet Take 220 mg by mouth daily as needed.    [DISCONTINUED] cyclobenzaprine (FLEXERIL) 5 MG tablet Take 1 tablet (5 mg total) by mouth 3 (three) times daily as needed.   [DISCONTINUED] methylPREDNISolone (MEDROL) 4 MG TBPK tablet 6 day taper; take as directed on package instructions   [DISCONTINUED] mometasone (ASMANEX) 220 MCG/INH inhaler Inhale 1 puff into the lungs daily. (Patient not taking: Reported on 08/07/2019)   [DISCONTINUED] ondansetron (ZOFRAN) 4 MG tablet  Take 1 tablet (4 mg total) by mouth every 8 (eight) hours as needed.   [DISCONTINUED] sertraline (ZOLOFT) 50 MG tablet Take 1 tablet (50 mg total) by mouth daily. Start with 0.5 tab ( ) PO q hs x 1 week, then increase to 1 tab PO q hs   No facility-administered medications prior to visit.    Review of Systems  Constitutional: Negative.   HENT: Negative.    Eyes: Negative.   Respiratory: Negative.    Cardiovascular: Negative.   Gastrointestinal: Negative.   Endocrine: Negative.   Genitourinary: Negative.   Musculoskeletal: Negative.   Skin: Negative.   Allergic/Immunologic: Negative.   Neurological: Negative.   Hematological: Negative.    Psychiatric/Behavioral: Negative.      Objective     BP 114/79 (BP Location: Right Arm, Patient Position: Sitting, Cuff Size: Normal)   Pulse 62   Temp 98.8 F (37.1 C) (Oral)   Resp 17   Ht  (1.778 m)   Wt 192 lb 3.2 oz (87.2 kg)   SpO2 97%   BMI 27.58 kg/m     Physical Exam Constitutional:      Appearance: Normal appearance. He is normal weight.  HENT:     Head: Normocephalic and atraumatic.     Right Ear: Tympanic membrane, ear canal and external ear normal.     Left Ear: Tympanic membrane, ear canal and external ear normal.     Nose: Nose normal.     Mouth/Throat:     Mouth: Mucous membranes are moist.     Pharynx: Oropharynx is clear.  Eyes:     Extraocular Movements: Extraocular movements intact.     Conjunctiva/sclera: Conjunctivae normal.     Pupils: Pupils are equal, round, and reactive to light.  Cardiovascular:     Rate and Rhythm: Normal rate and regular rhythm.     Pulses: Normal pulses.     Heart sounds: Normal heart sounds.  Pulmonary:     Effort: Pulmonary effort is normal.     Breath sounds: Normal breath sounds.  Abdominal:     General: Abdomen is flat. Bowel sounds are normal.     Palpations: Abdomen is soft.  Musculoskeletal:        General: Normal range of motion.     Cervical back: Normal range of motion and neck supple.  Skin:    General: Skin is warm and dry.  Neurological:     General: No focal deficit present.     Mental Status: He is alert and oriented to person, place, and time. Mental status is at baseline.  Psychiatric:        Mood and Affect: Mood normal.        Behavior: Behavior normal.        Thought Content: Thought content normal.        Judgment: Judgment normal.     Last depression screening scores    04/03/2022    8:58 AM 10/25/2020    7:25 AM 06/08/2019   10:13 AM  PHQ 2/9 Scores  PHQ - 2 Score PHQ- 9 Score Last fall risk screening    04/03/2022    8:59 AM  Fall Risk   Falls in the  past year? 0  Number falls in past yr: 0  Injury with Fall? 0   Last Audit-C alcohol use screening    04/03/2022    8:59 AM  Alcohol Use Disorder Test (AUDIT)  1.  How often do you have a drink containing alcohol? 2  2. How many drinks containing alcohol do you have on a typical day when you are drinking? 1  3. How often do you have six or more drinks on one occasion? 0  AUDIT-C Score 3   A score of 3 or more in women, and 4 or more in men indicates increased risk for alcohol abuse, EXCEPT if all of the points are from question 1   No results found for any visits on 04/03/22.  Assessment & Plan    Routine Health Maintenance and Physical Exam  Exercise Activities and Dietary recommendations --balanced diet high in fiber and protein, low in sugars, carbs, fats. --physical activity/exercise 30 minutes 3-5 times a week     Immunization History  Administered Date(s) Administered   Influenza,inj,Quad PF,6+ Mos 08/17/2019   Influenza-Unspecified 08/16/2014, 10/17/2015, 10/06/2020   Tdap 11/16/2009    Health Maintenance  Topic Date Due   COVID-19 Vaccine (1) Never done   Zoster Vaccines- Shingrix (1 of 2) Never done   TETANUS/TDAP  11/17/2019   INFLUENZA VACCINE  06/16/2022   COLONOSCOPY (Pts 45-10yrs Insurance coverage will need to be confirmed)  03/18/2031   Hepatitis C Screening  Completed   HIV Screening  Completed   HPV VACCINES  Aged Out    Discussed health benefits of physical activity, and encouraged him to engage in regular exercise appropriate for his age and condition.  Problem List Items Addressed This Visit       Other   Pre-diabetes   Relevant Orders   CBC with Differential/Platelet   Comprehensive metabolic panel   Lipid Panel With LDL/HDL Ratio   Hemoglobin A1c   GAD (generalized anxiety disorder)    Feels stable currently unmedicated. Reports almost panic-like symptoms Will use delta-9 occasionally to relax. Advised if symptoms increase can return  and can restart Zoloft, will discuss therapy if symptoms worsen       Relevant Orders   TSH + free T4   Nocturia    Will get PSA and if stable, can consider flomax.  W/ ed symptoms, consider urology referral. If psa elevated will refer to urology       Erectile dysfunction    Trial of sildenafil 50 mg. Advised pt to take 1 hour before activity. Advised if he ever experiences chest pain/in ambulance to let staff know If ED not improved w/ sildenafil will refer to urology       Relevant Medications   sildenafil (VIAGRA) 50 MG tablet   Acute pain of both knees    Discussed topical voltaren gel, ice/heat Discussed PT pt is not interested currently.       Other Visit Diagnoses     Annual physical exam    -  Primary   Relevant Orders   CBC with Differential/Platelet   Comprehensive metabolic panel   Lipid Panel With LDL/HDL Ratio   TSH + free T4   Prostate cancer screening       Relevant Orders   PSA   Moderate mixed hyperlipidemia not requiring statin therapy       Relevant Medications   sildenafil (VIAGRA) 50 MG tablet   Other Relevant Orders   Comprehensive metabolic panel   Lipid Panel With LDL/HDL Ratio        Return in about 6 months (around 10/04/2022) for chronic conditions.     I, [name], PA-C have reviewed all documentation for this visit. The documentation on  04/03/2022  for the exam, diagnosis, procedures, and orders are all accurate and complete.  [name], PA-C [facility] [address] [place], [place], [address] Office: [phone] Fax: [phone]   [facility]

## 2022-04-03 ENCOUNTER — Encounter: Payer: Self-pay | Admitting: Physician Assistant

## 2022-04-03 ENCOUNTER — Ambulatory Visit (INDEPENDENT_AMBULATORY_CARE_PROVIDER_SITE_OTHER): Payer: 59 | Admitting: Physician Assistant

## 2022-04-03 VITALS — BP 114/79 | HR 62 | Temp 98.8°F | Resp 17 | Ht 70.0 in | Wt 192.2 lb

## 2022-04-03 DIAGNOSIS — Z0000 Encounter for general adult medical examination without abnormal findings: Secondary | ICD-10-CM | POA: Diagnosis not present

## 2022-04-03 DIAGNOSIS — M25562 Pain in left knee: Secondary | ICD-10-CM | POA: Diagnosis not present

## 2022-04-03 DIAGNOSIS — M25561 Pain in right knee: Secondary | ICD-10-CM | POA: Insufficient documentation

## 2022-04-03 DIAGNOSIS — F411 Generalized anxiety disorder: Secondary | ICD-10-CM | POA: Diagnosis not present

## 2022-04-03 DIAGNOSIS — Z125 Encounter for screening for malignant neoplasm of prostate: Secondary | ICD-10-CM | POA: Diagnosis not present

## 2022-04-03 DIAGNOSIS — R351 Nocturia: Secondary | ICD-10-CM | POA: Insufficient documentation

## 2022-04-03 DIAGNOSIS — N529 Male erectile dysfunction, unspecified: Secondary | ICD-10-CM

## 2022-04-03 DIAGNOSIS — R7303 Prediabetes: Secondary | ICD-10-CM

## 2022-04-03 DIAGNOSIS — E782 Mixed hyperlipidemia: Secondary | ICD-10-CM | POA: Diagnosis not present

## 2022-04-03 MED ORDER — SILDENAFIL CITRATE 50 MG PO TABS: 50.0000 mg | ORAL_TABLET | Freq: Every day | ORAL | 1 refills | Status: DC | PRN

## 2022-04-03 NOTE — Assessment & Plan Note (Signed)
Feels stable currently unmedicated. Reports almost panic-like symptoms Will use delta-9 occasionally to relax. Advised if symptoms increase can return and can restart Zoloft, will discuss therapy if symptoms worsen

## 2022-04-03 NOTE — Assessment & Plan Note (Signed)
Discussed topical voltaren gel, ice/heat Discussed PT pt is not interested currently.

## 2022-04-03 NOTE — Assessment & Plan Note (Signed)
Will get PSA and if stable, can consider flomax.  W/ ed symptoms, consider urology referral. If psa elevated will refer to urology

## 2022-04-03 NOTE — Assessment & Plan Note (Signed)
Trial of sildenafil 50 mg. Advised pt to take 1 hour before activity. Advised if he ever experiences chest pain/in ambulance to let staff know If ED not improved w/ sildenafil will refer to urology

## 2022-04-04 LAB — COMPREHENSIVE METABOLIC PANEL
ALT: 28 IU/L (ref 0–44)
AST: 22 IU/L (ref 0–40)
Albumin/Globulin Ratio: 1.7 (ref 1.2–2.2)
Albumin: 4.6 g/dL (ref 3.8–4.8)
Alkaline Phosphatase: 67 IU/L (ref 44–121)
BUN/Creatinine Ratio: 12 (ref 10–24)
BUN: 14 mg/dL (ref 8–27)
Bilirubin Total: 0.6 mg/dL (ref 0.0–1.2)
CO2: 22 mmol/L (ref 20–29)
Calcium: 9.5 mg/dL (ref 8.6–10.2)
Chloride: 102 mmol/L (ref 96–106)
Creatinine, Ser: 1.16 mg/dL (ref 0.76–1.27)
Globulin, Total: 2.7 g/dL (ref 1.5–4.5)
Glucose: 115 mg/dL — ABNORMAL HIGH (ref 70–99)
Potassium: 4.5 mmol/L (ref 3.5–5.2)
Sodium: 139 mmol/L (ref 134–144)
Total Protein: 7.3 g/dL (ref 6.0–8.5)
eGFR: 70 mL/min/{1.73_m2} (ref >59)

## 2022-04-04 LAB — CBC WITH DIFFERENTIAL/PLATELET
Basophils Absolute: 0.1 10*3/uL (ref 0.0–0.2)
Basos: 1 %
EOS (ABSOLUTE): 0.1 10*3/uL (ref 0.0–0.4)
Eos: 2 %
Hematocrit: 46.2 % (ref 37.5–51.0)
Hemoglobin: 15.1 g/dL (ref 13.0–17.7)
Immature Grans (Abs): 0 10*3/uL (ref 0.0–0.1)
Immature Granulocytes: 0 %
Lymphocytes Absolute: 1.1 10*3/uL (ref 0.7–3.1)
Lymphs: 19 %
MCH: 30.7 pg (ref 26.6–33.0)
MCHC: 32.7 g/dL (ref 31.5–35.7)
MCV: 94 fL (ref 79–97)
Monocytes Absolute: 0.5 10*3/uL (ref 0.1–0.9)
Monocytes: 9 %
Neutrophils Absolute: 4 10*3/uL (ref 1.4–7.0)
Neutrophils: 69 %
Platelets: 235 10*3/uL (ref 150–450)
RBC: 4.92 x10E6/uL (ref 4.14–5.80)
RDW: 12.7 % (ref 11.6–15.4)
WBC: 5.7 10*3/uL (ref 3.4–10.8)

## 2022-04-04 LAB — LIPID PANEL WITH LDL/HDL RATIO
Cholesterol, Total: 150 mg/dL (ref 100–199)
HDL: 47 mg/dL (ref >39)
LDL Chol Calc (NIH): 85 mg/dL (ref 0–99)
LDL/HDL Ratio: 1.8 ratio (ref 0.0–3.6)
Triglycerides: 97 mg/dL (ref 0–149)
VLDL Cholesterol Cal: 18 mg/dL (ref 5–40)

## 2022-04-04 LAB — TSH+FREE T4
Free T4: 1.17 ng/dL (ref 0.82–1.77)
TSH: 2.52 u[IU]/mL (ref 0.450–4.500)

## 2022-04-04 LAB — HEMOGLOBIN A1C
Est. average glucose Bld gHb Est-mCnc: 123 mg/dL
Hgb A1c MFr Bld: 5.9 % — ABNORMAL HIGH (ref 4.8–5.6)

## 2022-04-04 LAB — PSA: Prostate Specific Ag, Serum: 1.4 ng/mL (ref 0.0–4.0)

## 2022-04-06 ENCOUNTER — Other Ambulatory Visit: Payer: Self-pay | Admitting: Physician Assistant

## 2022-04-06 DIAGNOSIS — R351 Nocturia: Secondary | ICD-10-CM

## 2022-04-06 MED ORDER — TAMSULOSIN HCL 0.4 MG PO CAPS: 0.4000 mg | ORAL_CAPSULE | Freq: Every day | ORAL | 1 refills | Status: DC

## 2022-06-08 DIAGNOSIS — S93602A Unspecified sprain of left foot, initial encounter: Secondary | ICD-10-CM | POA: Insufficient documentation

## 2022-08-13 ENCOUNTER — Other Ambulatory Visit: Payer: Self-pay | Admitting: Physician Assistant

## 2022-08-13 DIAGNOSIS — N529 Male erectile dysfunction, unspecified: Secondary | ICD-10-CM

## 2022-10-01 NOTE — Progress Notes (Signed)
I,Erik [name],acting as a Neurosurgeon for [employer], PA-C.,have documented all relevant documentation on the behalf of Erik Ferguson, PA-C,as directed by  Erik Ferguson, PA-C while in the presence of Erik Ferguson, PA-C.   Established patient visit   Patient: Erik [name]   DOB: 01/15/1956   66 y.o. Male  MRN: [id number] Visit Date: 10/02/2022  Today's healthcare provider: [name], PA-C   Cc. Chronic care follow up   Subjective    HPI  Follow up for nocturia  The patient was last seen for this 6 months ago. Changes made at last visit include start flomax. Pt reports he never picked up the flomax.  He is usually up 2-3 times at night to urinate. Sometimes he doesn't urinate fully. Feeling of frequency.   He feels that condition is Unchanged.  -----------------------------------------------------------------------------------------  Anxiety, Follow-up  He was last seen for anxiety 6 months ago. Changes made at last visit include use delta-9 occasionally to relax .  He feels his anxiety is mild and Improved since last visit.  Symptoms: No chest pain Yes difficulty concentrating  Yes dizziness Yes fatigue  No feelings of losing control No insomnia  No irritable No palpitations  No panic attacks No racing thoughts  No shortness of breath No sweating  No tremors/shakes    GAD-7 Results    10/02/2022    8:44 AM 07/13/2019    8:35 AM  GAD-7 Generalized Anxiety Disorder Screening Tool  1. Feeling Nervous, Anxious, or on Edge 1 3  2. Not Being Able to Stop or Control Worrying 1 3  3. Worrying Too Much About Different Things 1 3  4. Trouble Relaxing 2 1  5. Being So Restless it's Hard To Sit Still 0 2  6. Becoming Easily Annoyed or Irritable 1 0  7. Feeling Afraid As If Something Awful Might Happen 0 2  Total GAD-7 Score 6 14  Difficulty At Work, Home, or Getting  Along With Others? Not difficult at all Not difficult at all    PHQ-9 Scores     10/02/2022    8:44 AM 04/03/2022    8:58 AM 10/25/2020    7:25 AM  PHQ9 SCORE ONLY  PHQ-9 Total Score 5 8 5     ---------------------------------------------------------------------------------------------------   Medications: Outpatient Medications Prior to Visit  Medication Sig   Multiple Vitamin (MULTIVITAMIN WITH MINERALS) TABS tablet Take 1 tablet by mouth daily.   naproxen sodium (ALEVE) 220 MG tablet Take 220 mg by mouth daily as needed.    sildenafil (VIAGRA) 50 MG tablet TAKE 1 TABLET BY MOUTH DAILY AS NEEDED FOR ERECTILE DYSFUNCTION   [DISCONTINUED] tamsulosin (FLOMAX) 0.4 MG CAPS capsule Take 1 capsule (0.4 mg total) by mouth daily.   No facility-administered medications prior to visit.    Review of Systems  Constitutional:  Negative for fatigue and fever.  Respiratory:  Negative for cough and shortness of breath.   Cardiovascular:  Negative for chest pain, palpitations and leg swelling.  Genitourinary:  Positive for frequency.       Nocturia  Neurological:  Negative for dizziness and headaches.      Objective    BP 119/78 (BP Location: Left Arm, Patient Position: Sitting, Cuff Size: Normal)   Pulse 64   Wt 191 lb 3.2 oz (86.7 kg)   SpO2 100%   BMI 27.43 kg/m   Physical Exam Vitals reviewed.  Constitutional:      Appearance: He is not ill-appearing.  HENT:  Head: Normocephalic.  Eyes:     Conjunctiva/sclera: Conjunctivae normal.  Cardiovascular:     Rate and Rhythm: Normal rate.  Pulmonary:     Effort: Pulmonary effort is normal. No respiratory distress.  Neurological:     General: No focal deficit present.     Mental Status: He is alert and oriented to person, place, and time.  Psychiatric:        Mood and Affect: Mood normal.        Behavior: Behavior normal.     No results found for any visits on 10/02/22.  Assessment & Plan     Problem List Items Addressed This Visit       Other   GAD (generalized anxiety disorder)    Chronic,  stable, unmedicated currently. Will continue to monitor       Nocturia - Primary    Re-sent flomax to pharmacy Will repeat psa for stability, any further increase given LUTS would refer to urology      Relevant Medications   tamsulosin (FLOMAX) 0.4 MG CAPS capsule   Other Relevant Orders   PSA   Other Visit Diagnoses     Need for Td vaccine       Relevant Orders   Td : Tetanus/diphtheria >7yo Preservative  free (Completed)   Flu vaccine need       Relevant Orders   Flu Vaccine QUAD 6+ mos PF IM (Fluarix Quad PF) (Completed)       Return in about 6 months (around 04/02/2023) for CPE.     I, Erik Ferguson, PA-C have reviewed all documentation for this visit. The documentation on  10/02/2022  for the exam, diagnosis, procedures, and orders are all accurate and complete.  Erik Ferguson, PA-C [facility] [address] [place], [place], [address] Office: [phone] Fax: [phone]   [facility]

## 2022-10-02 ENCOUNTER — Encounter: Payer: Self-pay | Admitting: Physician Assistant

## 2022-10-02 ENCOUNTER — Ambulatory Visit (INDEPENDENT_AMBULATORY_CARE_PROVIDER_SITE_OTHER): Payer: 59 | Admitting: Physician Assistant

## 2022-10-02 VITALS — BP 119/78 | HR 64 | Wt 191.2 lb

## 2022-10-02 DIAGNOSIS — F411 Generalized anxiety disorder: Secondary | ICD-10-CM

## 2022-10-02 DIAGNOSIS — R351 Nocturia: Secondary | ICD-10-CM | POA: Diagnosis not present

## 2022-10-02 DIAGNOSIS — Z23 Encounter for immunization: Secondary | ICD-10-CM

## 2022-10-02 MED ORDER — TAMSULOSIN HCL 0.4 MG PO CAPS: 0.4000 mg | ORAL_CAPSULE | Freq: Every day | ORAL | 1 refills | Status: DC

## 2022-10-02 NOTE — Assessment & Plan Note (Signed)
Chronic, stable, unmedicated currently. Will continue to monitor

## 2022-10-02 NOTE — Patient Instructions (Signed)
Please be sure to call to schedule your complete physical exam for May 20,2024 or later. This can also be scheduled through your MyChart but please be sure to indicate you are being seen for a physical.   You will be due for lab work so be sure to be fasting . If you would like to receive lab work before your scheduled appointment please let us know as you do not need an appointment only an order to be placed. Our lab is open Monday-Friday 8:00 am - 11:30 am and 1:00 pm-4:30 pm in [address].

## 2022-10-02 NOTE — Assessment & Plan Note (Signed)
Re-sent flomax to pharmacy Will repeat psa for stability, any further increase given LUTS would refer to urology

## 2023-08-31 ENCOUNTER — Ambulatory Visit (INDEPENDENT_AMBULATORY_CARE_PROVIDER_SITE_OTHER): Payer: 59 | Admitting: Podiatry

## 2023-08-31 ENCOUNTER — Encounter: Payer: Self-pay | Admitting: Podiatry

## 2023-08-31 DIAGNOSIS — L600 Ingrowing nail: Secondary | ICD-10-CM | POA: Diagnosis not present

## 2023-08-31 DIAGNOSIS — K5120 Ulcerative (chronic) proctitis without complications: Secondary | ICD-10-CM | POA: Insufficient documentation

## 2023-08-31 DIAGNOSIS — M7701 Medial epicondylitis, right elbow: Secondary | ICD-10-CM | POA: Insufficient documentation

## 2023-08-31 DIAGNOSIS — K591 Functional diarrhea: Secondary | ICD-10-CM | POA: Insufficient documentation

## 2023-08-31 DIAGNOSIS — K219 Gastro-esophageal reflux disease without esophagitis: Secondary | ICD-10-CM | POA: Insufficient documentation

## 2023-08-31 MED ORDER — NEOMYCIN-POLYMYXIN-HC 1 % OT SOLN: OTIC | 1 refills | Status: DC

## 2023-08-31 NOTE — Patient Instructions (Signed)

## 2023-08-31 NOTE — Progress Notes (Signed)
  Subjective:  Patient ID: Erik [name], male    DOB: 21-Oct-1956,  MRN: [id number] HPI Chief Complaint  Patient presents with   Ingrown Toenail    Ingrown toe nail right big toe "patient states he thinks it's infected" x 1 week     67 y.o. male presents with the above complaint.   ROS: Denies fever chills nausea vomit muscle aches pains calf pain back pain chest pain shortness of breath  History reviewed. No pertinent past medical history. Past Surgical History:  Procedure Laterality Date   HERNIA REPAIR     TONSILLECTOMY      Current Outpatient Medications:    Multiple Vitamin (MULTIVITAMIN WITH MINERALS) TABS tablet, Take 1 tablet by mouth daily., Disp: , Rfl:    naproxen sodium (ALEVE) 220 MG tablet, Take 220 mg by mouth daily as needed. , Disp: , Rfl:    NEOMYCIN-POLYMYXIN-HYDROCORTISONE (CORTISPORIN) 1 % SOLN OTIC solution, Apply 1-2 drops to toe BID after soaking, Disp: 10 mL, Rfl: 1   sildenafil (VIAGRA) 50 MG tablet, TAKE 1 TABLET BY MOUTH DAILY AS NEEDED FOR ERECTILE DYSFUNCTION, Disp: 15 tablet, Rfl: 1   tamsulosin (FLOMAX) 0.4 MG CAPS capsule, Take 1 capsule (0.4 mg total) by mouth daily., Disp: 90 capsule, Rfl: 1  Allergies  Allergen Reactions   Propofol Nausea Only   Review of Systems Objective:  There were no vitals filed for this visit.  General: Well developed, nourished, in no acute distress, alert and oriented x3   Dermatological: Skin is warm, dry and supple bilateral. Nails x 10 are well maintained; remaining integument appears unremarkable at this time. There are no open sores, no preulcerative lesions, no rash or signs of infection present.  Ingrown toenail tibial-fibular border of the hallux right.  Mild erythema no purulence no malodor  Vascular: Dorsalis Pedis artery and Posterior Tibial artery pedal pulses are 2/4 bilateral with immedate capillary fill time. Pedal hair growth present. No varicosities and no lower extremity edema present bilateral.    Neruologic: Grossly intact via light touch bilateral. Vibratory intact via tuning fork bilateral. Protective threshold with Semmes Wienstein monofilament intact to all pedal sites bilateral. Patellar and Achilles deep tendon reflexes 2+ bilateral. No Babinski or clonus noted bilateral.   Musculoskeletal: No gross boney pedal deformities bilateral. No pain, crepitus, or limitation noted with foot and ankle range of motion bilateral. Muscular strength 5/5 in all groups tested bilateral.  Gait: Unassisted, Nonantalgic.    Radiographs:  None taken  Assessment & Plan:   Assessment: Ingrown toenail hallux right  Plan: Chemical matricectomy was performed today tolerated seizure well after local anesthetic was administered was given both oral and written home-going structure for care and soaking the toe as well as a prescription for Cortisporin Otic to be applied twice daily after soaking.  Follow-up with him in 2 weeks     Chellsie Gomer T. [place], [place]

## 2023-09-03 ENCOUNTER — Other Ambulatory Visit: Payer: Self-pay | Admitting: Physician Assistant

## 2023-09-03 DIAGNOSIS — N529 Male erectile dysfunction, unspecified: Secondary | ICD-10-CM

## 2023-09-08 ENCOUNTER — Other Ambulatory Visit: Payer: Self-pay | Admitting: Physician Assistant

## 2023-09-08 DIAGNOSIS — N529 Male erectile dysfunction, unspecified: Secondary | ICD-10-CM

## 2023-09-08 NOTE — Telephone Encounter (Signed)
Medication Refill - Medication  sildenafil (VIAGRA) 50 MG tablet [[id number]]  Has the patient contacted their pharmacy? yes    Preferred Pharmacy (with phone number or street name):  [id number] [address]  [address], [place] [address]  Phone:  [phone]  Fax:  [phone]  DEA #:  [id number] Reason: --    Has the patient been seen for an appointment in the last year OR does the patient have an upcoming appointment? Yd   Agent: Please be advised that RX refills may take up to 3 business days. We ask that you follow-up with your pharmacy.

## 2023-09-10 NOTE — Telephone Encounter (Signed)
Requested medication (s) are due for refill today: yes  Requested medication (s) are on the active medication list: yes  Last refill:  08/13/22  Future visit scheduled: no  Notes to clinic:  Unable to refill per protocol, last refilled by provider no longer at practice, routing for review.     Requested Prescriptions  Pending Prescriptions Disp Refills   sildenafil (VIAGRA) 50 MG tablet 15 tablet 1     Urology: Erectile Dysfunction Agents Failed - 09/08/2023 12:28 PM      Failed - AST in normal range and within 360 days    AST  Date Value Ref Range Status  04/03/2022 22 0 - 40 IU/L Final         Failed - ALT in normal range and within 360 days    ALT  Date Value Ref Range Status  04/03/2022 28 0 - 44 IU/L Final         Passed - Last BP in normal range    BP Readings from Last 1 Encounters:  10/02/22 119/78         Passed - Valid encounter within last 12 months    Recent Outpatient Visits           11 months ago Nocturia   [facility] [name], PA-C   1 year ago Annual physical exam   [facility] [name], PA-C   2 years ago Annual physical exam   [facility] [place], [place], [place]   3 years ago COVID-19   [facility] [place], [place], [place]   4 years ago GAD (generalized anxiety disorder)   [facility] [place], [place], [place]

## 2023-09-17 ENCOUNTER — Other Ambulatory Visit: Payer: Self-pay | Admitting: Physician Assistant

## 2023-09-17 DIAGNOSIS — N529 Male erectile dysfunction, unspecified: Secondary | ICD-10-CM

## 2023-09-17 NOTE — Telephone Encounter (Signed)
Medication Refill - Medication:  sildenafil (VIAGRA) 50 MG tablet  *Patient is completely out  Has the patient contacted their pharmacy? Yes, advised to contact PCP  Preferred Pharmacy (with phone number or street name):  [id number] [place] [address]  Phone: [phone] Fax: [phone]    Has the patient been seen for an appointment in the last year OR does the patient have an upcoming appointment? Yes. CPE & Establish care with Dr Jacquenette Shone on Monday 09/20/23

## 2023-09-17 NOTE — Telephone Encounter (Signed)
Requested medication (s) are due for refill today: yes  Requested medication (s) are on the active medication list: yes  Last refill:  08/13/22 #15/1  Future visit scheduled: yes   Notes to clinic:  Unable to refill per protocol due to failed labs, no updated results.    Requested Prescriptions  Pending Prescriptions Disp Refills   sildenafil (VIAGRA) 50 MG tablet 15 tablet 1     Urology: Erectile Dysfunction Agents Failed - 09/17/2023  1:01 PM      Failed - AST in normal range and within 360 days    AST  Date Value Ref Range Status  04/03/2022 22 0 - 40 IU/L Final         Failed - ALT in normal range and within 360 days    ALT  Date Value Ref Range Status  04/03/2022 28 0 - 44 IU/L Final         Passed - Last BP in normal range    BP Readings from Last 1 Encounters:  10/02/22 119/78         Passed - Valid encounter within last 12 months    Recent Outpatient Visits           11 months ago Nocturia   [facility] [name], PA-C   1 year ago Annual physical exam   [facility] [name], PA-C   2 years ago Annual physical exam   [facility] [place], [place], [place]   3 years ago COVID-19   [facility] [place], [place], [place]   4 years ago GAD (generalized anxiety disorder)   [facility] [place], [place], [place]       Future Appointments             In 3 days Pardue, Monico Blitz, DO [facility]

## 2023-09-20 ENCOUNTER — Ambulatory Visit: Payer: 59 | Admitting: Family Medicine

## 2023-09-20 ENCOUNTER — Encounter: Payer: Self-pay | Admitting: Family Medicine

## 2023-09-20 VITALS — BP 119/76 | HR 66 | Ht 70.5 in | Wt 195.0 lb

## 2023-09-20 DIAGNOSIS — Z0000 Encounter for general adult medical examination without abnormal findings: Secondary | ICD-10-CM | POA: Insufficient documentation

## 2023-09-20 DIAGNOSIS — Z23 Encounter for immunization: Secondary | ICD-10-CM | POA: Diagnosis not present

## 2023-09-20 DIAGNOSIS — Z0001 Encounter for general adult medical examination with abnormal findings: Secondary | ICD-10-CM

## 2023-09-20 DIAGNOSIS — R351 Nocturia: Secondary | ICD-10-CM

## 2023-09-20 DIAGNOSIS — N529 Male erectile dysfunction, unspecified: Secondary | ICD-10-CM

## 2023-09-20 DIAGNOSIS — G4733 Obstructive sleep apnea (adult) (pediatric): Secondary | ICD-10-CM

## 2023-09-20 DIAGNOSIS — M25562 Pain in left knee: Secondary | ICD-10-CM

## 2023-09-20 DIAGNOSIS — G8929 Other chronic pain: Secondary | ICD-10-CM | POA: Insufficient documentation

## 2023-09-20 DIAGNOSIS — R7303 Prediabetes: Secondary | ICD-10-CM | POA: Diagnosis not present

## 2023-09-20 DIAGNOSIS — M25561 Pain in right knee: Secondary | ICD-10-CM

## 2023-09-20 DIAGNOSIS — R5382 Chronic fatigue, unspecified: Secondary | ICD-10-CM | POA: Diagnosis not present

## 2023-09-20 DIAGNOSIS — R7989 Other specified abnormal findings of blood chemistry: Secondary | ICD-10-CM

## 2023-09-20 MED ORDER — SILDENAFIL CITRATE 50 MG PO TABS: 50.0000 mg | ORAL_TABLET | ORAL | 1 refills | Status: DC | PRN

## 2023-09-20 NOTE — Patient Instructions (Addendum)
Recommend:  Prevnar-20  Shingles (Shingrix)  Take 2 naproxen (Aleve) 220 mg tablets in the morning and at night for 3 to 5 days, then stop.  Do not take ibuprofen, aspirin, BC powder, Goody powder or similar medications while taking this.  -  Tylenol is okay with this.

## 2023-09-20 NOTE — Assessment & Plan Note (Signed)
Refill

## 2023-09-20 NOTE — Assessment & Plan Note (Signed)
Gave patient instructions to take OTC naproxen at high dose level for several days to reduce inflammation.   Gave him a handout for knee exercises to do at home to stretch and strengthen the supporting muscles around his knees. Will consider referral to PT in the future if this does not improve his pain.

## 2023-09-20 NOTE — Assessment & Plan Note (Signed)
Patient has history of testosterone on the lower end of normal (historically has been [phone]).   He has never used testosterone supplementation. Will recheck his level today.

## 2023-09-20 NOTE — Assessment & Plan Note (Addendum)
Physical exam overall unremarkable except as noted above. Routine lab work ordered as noted. Counseled patient on recommended vaccines that he is not opposed to getting (he declined covid).  Patient would prefer to defer any administration due to having just received the influenza vaccine.

## 2023-09-20 NOTE — Assessment & Plan Note (Signed)
Patient endorses persistent, chronic fatigue.  Will evaluate for possible metabolic causes as noted below and including testosterone levels. Patient's fatigue may also stem from his sleep apnea as he has not used a CPAP in many years.  Will re-evaluate for this as noted.

## 2023-09-20 NOTE — Progress Notes (Signed)
Complete physical exam   Patient: Erik [name]   DOB: 17-Feb-1956   67 y.o. Male  MRN: [id number] Visit Date: 09/20/2023  Today's healthcare provider: Sherlyn Hay, DO   Chief Complaint  Patient presents with   Annual Exam   Subjective    Erik [name] is a 67 y.o. male who presents today for a complete physical exam.  He reports consuming a general diet. The patient has a physically strenuous job, but has no regular exercise apart from work.  He generally feels ok overall. He reports sleeping fairly well. He is taking melatonin and feels he sleeps pretty well but is still waking up several times through the night to use the restroom.  He does have additional problems to discuss today.   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  - 12 hour shifts 3-2-2 with switch to 2-2-3  - painful and stiff; rolls around or bounces to try and get up.  Sometimes takes aleve every day  History reviewed. No pertinent past medical history. Past Surgical History:  Procedure Laterality Date   HERNIA REPAIR     TONSILLECTOMY     Social History   Socioeconomic History   Marital status: Married    Spouse name: Not on file   Number of children: Not on file   Years of education: Not on file   Highest education level: Not on file  Occupational History   Not on file  Tobacco Use   Smoking status: Never   Smokeless tobacco: Never  Vaping Use   Vaping status: Never Used  Substance and Sexual Activity   Alcohol use: Yes    Comment: occ   Drug use: Never   Sexual activity: Not on file  Other Topics Concern   Not on file  Social History Narrative   Not on file   Social Determinants of Health   Financial Resource Strain: Not on file  Food Insecurity: Not on file  Transportation Needs: Not on file  Physical Activity: Not on file  Stress: Not on file  Social Connections: Unknown (07/22/2023)    Received from [facility]   Social Network    Social Network: Not on file  Intimate Partner Violence: Unknown (07/22/2023)   Received from [facility]   HITS    Physically Hurt: Not on file    Insult or Talk Down To: Not on file    Threaten Physical Harm: Not on file    Scream or Curse: Not on file   Family Status  Relation Name Status   Mother  Deceased   Father  Alive   Sister  Alive   Brother  Alive  No partnership data on file   Family History  Problem Relation Age of Onset   Cancer Mother        Breast   Breast cancer Mother    Cancer Father        Esophagus   Hypertension Sister    Supraventricular tachycardia Sister    Breast cancer Sister    Hypertension Brother    Allergies  Allergen Reactions   Propofol Nausea Only    Patient Care Team: [name], PA-C as PCP - General (Physician Assistant)   Medications: Outpatient Medications Prior to Visit  Medication Sig   Melatonin 10 MG TABS Take by mouth.   Multiple Vitamin (MULTIVITAMIN WITH MINERALS) TABS  tablet Take 1 tablet by mouth daily.   naproxen sodium (ALEVE) 220 MG tablet Take 220 mg by mouth daily as needed.    Omega-3 Fatty Acids (FISH OIL OMEGA-3 PO) Take by mouth.   Probiotic Product (PROBIOTIC ADVANCED PO) Take by mouth.   [DISCONTINUED] sildenafil (VIAGRA) 50 MG tablet TAKE 1 TABLET BY MOUTH DAILY AS NEEDED FOR ERECTILE DYSFUNCTION   [DISCONTINUED] NEOMYCIN-POLYMYXIN-HYDROCORTISONE (CORTISPORIN) 1 % SOLN OTIC solution Apply 1-2 drops to toe BID after soaking   [DISCONTINUED] tamsulosin (FLOMAX) 0.4 MG CAPS capsule Take 1 capsule (0.4 mg total) by mouth daily.   No facility-administered medications prior to visit.    Review of Systems  Constitutional:  Negative for appetite change, chills, fatigue and fever.  HENT:  Negative for congestion, ear pain, hearing loss, nosebleeds and trouble swallowing.   Eyes:  Negative for pain and visual disturbance.  Respiratory:  Negative for cough,  chest tightness and shortness of breath.   Cardiovascular:  Negative for chest pain, palpitations and leg swelling.  Gastrointestinal:  Negative for abdominal pain, blood in stool, constipation, diarrhea, nausea and vomiting.  Endocrine: Negative for polydipsia, polyphagia and polyuria.  Genitourinary:  Negative for dysuria and flank pain.       +nocturia  Musculoskeletal:  Positive for arthralgias (bilateral knee). Negative for back pain, joint swelling, myalgias and neck stiffness.  Skin:  Negative for color change, rash and wound.  Neurological:  Negative for dizziness, tremors, seizures, speech difficulty, weakness, light-headedness and headaches.  Psychiatric/Behavioral:  Positive for sleep disturbance. Negative for behavioral problems, confusion, decreased concentration and dysphoric mood. The patient is not nervous/anxious.   All other systems reviewed and are negative.      Objective    BP 119/76 (BP Location: Left Arm, Patient Position: Sitting, Cuff Size: Normal)   Pulse 66   Ht 5' 10.5" (1.791 m)   Wt 195 lb (88.5 kg)   SpO2 96%   BMI 27.58 kg/m    Physical Exam Vitals and nursing note reviewed.  Constitutional:      General: He is awake.     Appearance: Normal appearance.  HENT:     Head: Normocephalic and atraumatic.     Right Ear: Tympanic membrane, ear canal and external ear normal.     Left Ear: Tympanic membrane, ear canal and external ear normal.     Nose: Nose normal.     Mouth/Throat:     Mouth: Mucous membranes are moist.     Pharynx: Oropharynx is clear. No oropharyngeal exudate or posterior oropharyngeal erythema.  Eyes:     General: No scleral icterus.    Extraocular Movements: Extraocular movements intact.     Conjunctiva/sclera: Conjunctivae normal.     Pupils: Pupils are equal, round, and reactive to light.  Neck:     Thyroid: No thyromegaly or thyroid tenderness.  Cardiovascular:     Rate and Rhythm: Normal rate and regular rhythm.      Pulses: Normal pulses.     Heart sounds: Normal heart sounds.  Pulmonary:     Effort: Pulmonary effort is normal. No tachypnea, bradypnea or respiratory distress.     Breath sounds: Normal breath sounds. No stridor. No wheezing, rhonchi or rales.  Abdominal:     General: Bowel sounds are normal. There is no distension.     Palpations: Abdomen is soft. There is no mass.     Tenderness: There is no abdominal tenderness. There is no guarding.     Hernia: No hernia is  present.  Musculoskeletal:     Cervical back: Normal range of motion and neck supple.     Right lower leg: No edema.     Left lower leg: No edema.  Lymphadenopathy:     Cervical: No cervical adenopathy.  Skin:    General: Skin is warm and dry.  Neurological:     Mental Status: He is alert and oriented to person, place, and time. Mental status is at baseline.  Psychiatric:        Mood and Affect: Mood normal.        Behavior: Behavior normal.      Last depression screening scores    09/20/2023    1:13 PM 10/02/2022    8:44 AM 04/03/2022    8:58 AM  PHQ 2/9 Scores  PHQ - 2 Score 0 1 2  PHQ- 9 Score 4  8   Last fall risk screening    10/02/2022    8:45 AM  Fall Risk   Falls in the past year? 0  Number falls in past yr: 0  Injury with Fall? 0  Risk for fall due to : No Fall Risks  Follow up Falls evaluation completed   Last Audit-C alcohol use screening    04/03/2022    8:59 AM  Alcohol Use Disorder Test (AUDIT)  1. How often do you have a drink containing alcohol? 2  2. How many drinks containing alcohol do you have on a typical day when you are drinking? 1  3. How often do you have six or more drinks on one occasion? 0  AUDIT-C Score 3   A score of 3 or more in women, and 4 or more in men indicates increased risk for alcohol abuse, EXCEPT if all of the points are from question 1   No results found for any visits on 09/20/23.  Assessment & Plan    Routine Health Maintenance and Physical  Exam  Exercise Activities and Dietary recommendations  Goals   None     Immunization History  Administered Date(s) Administered   Fluad Trivalent(High Dose 65+) 09/20/2023   Influenza,inj,Quad PF,6+ Mos 08/17/2019, 10/02/2022   Influenza-Unspecified 08/16/2014, 10/17/2015, 10/06/2020   Td 10/02/2022   Tdap 11/16/2009    Health Maintenance  Topic Date Due   Pneumonia Vaccine 93+ Years old (1 of 1 - PCV) 10/04/2023 (Originally 02/10/2023)   Zoster Vaccines- Shingrix (1 of 2) 10/04/2023 (Originally 02/10/2008)   COVID-19 Vaccine (1 - 2023-24 season) 10/06/2023 (Originally 07/18/2023)   Colonoscopy  03/17/2024   DTaP/Tdap/Td (3 - Td or Tdap) 10/02/2032   INFLUENZA VACCINE  Completed   Hepatitis C Screening  Completed   HIV Screening  Completed   HPV VACCINES  Aged Out    Discussed health benefits of physical activity, and encouraged him to engage in regular exercise appropriate for his age and condition.   Annual physical exam Assessment & Plan: Physical exam overall unremarkable except as noted above. Routine lab work ordered as noted. Counseled patient on recommended vaccines that he is not opposed to getting (he declined covid).  Patient would prefer to defer any administration due to having just received the influenza vaccine.  Orders: -     Microalbumin / creatinine urine ratio -     Comprehensive metabolic panel -     Lipid panel  Encounter for immunization -     Flu Vaccine Trivalent High Dose (Fluad)  Pre-diabetes -     Hemoglobin A1c  Chronic fatigue Assessment &  Plan: Patient endorses persistent, chronic fatigue.  Will evaluate for possible metabolic causes as noted below and including testosterone levels. Patient's fatigue may also stem from his sleep apnea as he has not used a CPAP in many years.  Will re-evaluate for this as noted.  Orders: -     VITAMIN D 25 Hydroxy (Vit-D Deficiency, Fractures) -     TSH Rfx on Abnormal to Free T4 -     Vitamin B12 -      CBC  Low serum testosterone Assessment & Plan: Patient has history of testosterone on the lower end of normal (historically has been 283-388).   He has never used testosterone supplementation. Will recheck his level today.  Orders: -     Testosterone,Free and Total  Erectile dysfunction, unspecified erectile dysfunction type Assessment & Plan: Refill  Orders: -     Sildenafil Citrate; Take 1 tablet (50 mg total) by mouth as needed for erectile dysfunction.  Dispense: 15 tablet; Refill: 1  OSA on CPAP Assessment & Plan: Nonadherent to CPAP. No longer has CPAP. Continues to have nightly snoring, interrupted sleep, vivid dreams and nighttime urination.  Wakes up un-refreshed.  Orders: -     Ambulatory referral to Sleep Studies  Nocturia Assessment & Plan: Patient's nocturia was unrelieved by tamsulosin. He does have history of OSA which required CPAP.  He has gained weight since he stopped using the CPAP and no longer has it. Will re-refer patient for sleep study to evaluate his needs at this point as it has been many years since he had a sleep study (he estimates 15 years).  Orders: -     Ambulatory referral to Sleep Studies   Return in about 1 year (around 09/19/2024) for CPE.     I discussed the assessment and treatment plan with the patient  The patient was provided an opportunity to ask questions and all were answered. The patient agreed with the plan and demonstrated an understanding of the instructions.   The patient was advised to call back or seek an in-person evaluation if the symptoms worsen or if the condition fails to improve as anticipated.    Sherlyn Hay, DO  [facility] [phone] (phone) [phone] (fax)  [facility]

## 2023-09-20 NOTE — Assessment & Plan Note (Signed)
Patient's nocturia was unrelieved by tamsulosin. He does have history of OSA which required CPAP.  He has gained weight since he stopped using the CPAP and no longer has it. Will re-refer patient for sleep study to evaluate his needs at this point as it has been many years since he had a sleep study (he estimates 15 years).

## 2023-09-20 NOTE — Assessment & Plan Note (Signed)
Nonadherent to CPAP. No longer has CPAP. Continues to have nightly snoring, interrupted sleep, vivid dreams and nighttime urination.  Wakes up un-refreshed.

## 2023-09-22 ENCOUNTER — Encounter: Payer: Self-pay | Admitting: Podiatry

## 2023-09-22 ENCOUNTER — Ambulatory Visit (INDEPENDENT_AMBULATORY_CARE_PROVIDER_SITE_OTHER): Payer: 59 | Admitting: Podiatry

## 2023-09-22 DIAGNOSIS — L600 Ingrowing nail: Secondary | ICD-10-CM

## 2023-09-22 DIAGNOSIS — Z9889 Other specified postprocedural states: Secondary | ICD-10-CM

## 2023-09-22 NOTE — Progress Notes (Signed)
He presents today for a follow-up matrixectomy hallux right both borders were fixed last time he was in.  Denies fever chills nausea vomit states he has been soaking until recently.  States that is doing great.  Objective: Vital signs are stable alert oriented x 3.  There is no erythema edema cellulitis drainage or odor.  Assessment: Well-healing surgical toe hallux right.  Plan: I will follow-up with him on an as-needed basis.

## 2023-09-25 LAB — LIPID PANEL
Chol/HDL Ratio: 3 ratio (ref 0.0–5.0)
Cholesterol, Total: 146 mg/dL (ref 100–199)
HDL: 48 mg/dL
LDL Chol Calc (NIH): 83 mg/dL (ref 0–99)
Triglycerides: 79 mg/dL (ref 0–149)
VLDL Cholesterol Cal: 15 mg/dL (ref 5–40)

## 2023-09-25 LAB — COMPREHENSIVE METABOLIC PANEL
ALT: 30 [IU]/L (ref 0–44)
AST: 22 [IU]/L (ref 0–40)
Albumin: 4 g/dL (ref 3.9–4.9)
Alkaline Phosphatase: 71 [IU]/L (ref 44–121)
BUN/Creatinine Ratio: 14 (ref 10–24)
BUN: 17 mg/dL (ref 8–27)
Bilirubin Total: 0.5 mg/dL (ref 0.0–1.2)
CO2: 24 mmol/L (ref 20–29)
Calcium: 8.9 mg/dL (ref 8.6–10.2)
Chloride: 104 mmol/L (ref 96–106)
Creatinine, Ser: 1.19 mg/dL (ref 0.76–1.27)
Globulin, Total: 2.7 g/dL (ref 1.5–4.5)
Glucose: 99 mg/dL (ref 70–99)
Potassium: 4.5 mmol/L (ref 3.5–5.2)
Sodium: 141 mmol/L (ref 134–144)
Total Protein: 6.7 g/dL (ref 6.0–8.5)
eGFR: 68 mL/min/{1.73_m2} (ref >59)

## 2023-09-25 LAB — HEMOGLOBIN A1C
Est. average glucose Bld gHb Est-mCnc: 126 mg/dL
Hgb A1c MFr Bld: 6 % — ABNORMAL HIGH (ref 4.8–5.6)

## 2023-09-25 LAB — TSH RFX ON ABNORMAL TO FREE T4: TSH: 3.04 u[IU]/mL (ref 0.450–4.500)

## 2023-09-25 LAB — CBC
Hematocrit: 47.1 % (ref 37.5–51.0)
Hemoglobin: 14.8 g/dL (ref 13.0–17.7)
MCH: 29.7 pg (ref 26.6–33.0)
MCHC: 31.4 g/dL — ABNORMAL LOW (ref 31.5–35.7)
MCV: 94 fL (ref 79–97)
Platelets: 243 10*3/uL (ref 150–450)
RBC: 4.99 x10E6/uL (ref 4.14–5.80)
RDW: 12.4 % (ref 11.6–15.4)
WBC: 6.4 10*3/uL (ref 3.4–10.8)

## 2023-09-25 LAB — TESTOSTERONE,FREE AND TOTAL
Testosterone, Free: 6.9 pg/mL (ref 6.6–18.1)
Testosterone: 384 ng/dL (ref 264–916)

## 2023-09-25 LAB — MICROALBUMIN / CREATININE URINE RATIO
Creatinine, Urine: 112.6 mg/dL
Microalb/Creat Ratio: <3 mg/g{creat} (ref 0–29)
Microalbumin, Urine: <3 ug/mL

## 2023-09-25 LAB — VITAMIN B12: Vitamin B-12: 562 pg/mL (ref 232–1245)

## 2023-09-25 LAB — VITAMIN D 25 HYDROXY (VIT D DEFICIENCY, FRACTURES): Vit D, 25-Hydroxy: 40.9 ng/mL (ref 30.0–100.0)

## 2024-03-02 ENCOUNTER — Other Ambulatory Visit: Payer: Self-pay | Admitting: Family Medicine

## 2024-03-02 DIAGNOSIS — N529 Male erectile dysfunction, unspecified: Secondary | ICD-10-CM

## 2024-04-20 ENCOUNTER — Encounter: Payer: Self-pay | Admitting: Family Medicine

## 2024-04-20 ENCOUNTER — Ambulatory Visit: Admitting: Family Medicine

## 2024-04-20 VITALS — BP 132/86 | HR 74 | Temp 98.5°F | Resp 16 | Wt 193.0 lb

## 2024-04-20 DIAGNOSIS — R0609 Other forms of dyspnea: Secondary | ICD-10-CM | POA: Diagnosis not present

## 2024-04-20 DIAGNOSIS — Z125 Encounter for screening for malignant neoplasm of prostate: Secondary | ICD-10-CM

## 2024-04-20 DIAGNOSIS — R7303 Prediabetes: Secondary | ICD-10-CM | POA: Diagnosis not present

## 2024-04-20 MED ORDER — SOLIFENACIN SUCCINATE 10 MG PO TABS: 10.0000 mg | ORAL_TABLET | Freq: Every day | ORAL | 3 refills | Status: DC

## 2024-04-20 NOTE — Progress Notes (Signed)
 Subjective:     Patient ID: Erik [name], male   DOB: 1956-09-13, 68 y.o.   MRN: [id number]  HPI Here to establish care.  Patient denies any significant past medical history.  He has a history of prediabetes.  He also has a history of obstructive sleep apnea however he states that he is not using his CPAP machine.  He is overdue for Pneumovax 23 as well as Shingrix.  He declines these today.  He is due for prostate cancer screening.  Colonoscopy has been scheduled and he is following up with GI later this year.  Patient states for the last 2 months he has had chest pain.  The pain is primarily in the left chest wall.  It is an aching soreness.  It is unrelated to activity.  However he has noticed more dyspnea on exertion over the last few months.  Activity does not trigger the pain.  It is not worse with activity.  He denies any relationship with food.  He denies any nausea or vomiting.  I am able to reproduce the pain somewhat with palpation on the left pectoralis muscle at its insertion on the humerus.  Chest presses exacerbate the pain.  However the patient does report significant dyspnea on exertion  No past medical history on file. Reviewing old records shows remote history of sleep apnea.  Past Surgical History:  Procedure Laterality Date   HERNIA REPAIR     TONSILLECTOMY     Current Outpatient Medications on File Prior to Visit  Medication Sig Dispense Refill   Melatonin 10 MG TABS Take by mouth.     Multiple Vitamin (MULTIVITAMIN WITH MINERALS) TABS tablet Take 1 tablet by mouth daily.     naproxen  sodium (ALEVE ) 220 MG tablet Take 220 mg by mouth daily as needed.      Omega-3 Fatty Acids (FISH OIL OMEGA-3 PO) Take by mouth.     Probiotic Product (PROBIOTIC ADVANCED PO) Take by mouth.     sildenafil  (VIAGRA ) 50 MG tablet TAKE 1 TABLET BY MOUTH AS NEEDED FOR ERECTILE DYSFUNCTION 15 tablet 1   No current facility-administered medications on file prior to visit.   Allergies  Allergen  Reactions   Propofol Nausea Only   Social History   Socioeconomic History   Marital status: Married    Spouse name: Not on file   Number of children: Not on file   Years of education: Not on file   Highest education level: Associate degree: occupational, Scientist, product/process development, or vocational program  Occupational History   Not on file  Tobacco Use   Smoking status: Never   Smokeless tobacco: Never  Vaping Use   Vaping status: Never Used  Substance and Sexual Activity   Alcohol use: Yes    Comment: occ   Drug use: Never   Sexual activity: Not on file  Other Topics Concern   Not on file  Social History Narrative   Not on file   Social Drivers of Health   Financial Resource Strain: Low Risk  (04/19/2024)   Overall Financial Resource Strain (CARDIA)    Difficulty of Paying Living Expenses: Not hard at all  Food Insecurity: No Food Insecurity (04/19/2024)   Hunger Vital Sign    Worried About Running Out of Food in the Last Year: Never true    Ran Out of Food in the Last Year: Never true  Transportation Needs: No Transportation Needs (04/19/2024)   PRAPARE - Administrator, Civil Service (Medical):  No    Lack of Transportation (Non-Medical): No  Physical Activity: Unknown (04/19/2024)   Exercise Vital Sign    Days of Exercise per Week: 0 days    Minutes of Exercise per Session: Not on file  Stress: Stress Concern Present (04/19/2024)   [employer] of Occupational Health - Occupational Stress Questionnaire    Feeling of Stress : To some extent  Social Connections: Unknown (04/19/2024)   Social Connection and Isolation Panel [NHANES]    Frequency of Communication with Friends and Family: Once a week    Frequency of Social Gatherings with Friends and Family: Patient declined    Attends Religious Services: More than 4 times per year    Active Member of [employer] or Organizations: No    Attends Engineer, structural: Not on file    Marital Status: Married  Intimate Partner  Violence: Unknown (07/22/2023)   Received from [facility]   HITS    Physically Hurt: Not on file    Insult or Talk Down To: Not on file    Threaten Physical Harm: Not on file    Scream or Curse: Not on file   Family History  Problem Relation Age of Onset   Cancer Mother        Breast   Breast cancer Mother    Cancer Father        Esophagus   Hypertension Sister    Supraventricular tachycardia Sister    Breast cancer Sister    Hypertension Brother       Review of Systems  All other systems reviewed and are negative.      Objective:   Physical Exam Vitals reviewed.  Constitutional:      General: He is not in acute distress.    Appearance: Normal appearance. He is normal weight. He is not ill-appearing, toxic-appearing or diaphoretic.  HENT:     Head: Normocephalic and atraumatic.     Nose: Nose normal.     Mouth/Throat:     Pharynx: No oropharyngeal exudate or posterior oropharyngeal erythema.  Eyes:     Extraocular Movements: Extraocular movements intact.     Conjunctiva/sclera: Conjunctivae normal.     Pupils: Pupils are equal, round, and reactive to light.  Neck:     Vascular: No carotid bruit.  Cardiovascular:     Rate and Rhythm: Normal rate and regular rhythm.     Pulses: Normal pulses.     Heart sounds: Normal heart sounds. No murmur heard.    No friction rub. No gallop.  Pulmonary:     Effort: Pulmonary effort is normal. No respiratory distress.     Breath sounds: Normal breath sounds. No wheezing, rhonchi or rales.  Abdominal:     General: Abdomen is flat. Bowel sounds are normal. There is no distension.     Palpations: Abdomen is soft.     Tenderness: There is no abdominal tenderness. There is no guarding or rebound.  Musculoskeletal:     Right lower leg: No edema.     Left lower leg: No edema.  Lymphadenopathy:     Cervical: No cervical adenopathy.  Skin:    Findings: No erythema or rash.  Neurological:     General: No focal deficit present.      Mental Status: He is alert and oriented to person, place, and time. Mental status is at baseline.     Cranial Nerves: No cranial nerve deficit.     Motor: No weakness.     Coordination:  Coordination normal.     Gait: Gait normal.     Deep Tendon Reflexes: Reflexes normal.  Psychiatric:        Mood and Affect: Mood normal.        Behavior: Behavior normal.        Thought Content: Thought content normal.        Judgment: Judgment normal.      Assessment:     Pre-diabetes - Plan: Hemoglobin A1c, CBC with Differential/Platelet, Comprehensive metabolic panel with GFR, Lipid panel  Dyspnea on exertion - Plan: Ambulatory referral to Cardiology  Prostate cancer screening - Plan: PSA     Plan:     Chest pain seems to be more muscular today on exam.  However he does have profound dyspnea on exertion.  Had a stress test 6 years ago that was negative along with a normal echocardiogram.  Patient would like to see cardiology for another stress test given his dyspnea on exertion.  We also discussed coronary artery calcium scoring but I believe the stress test would be most appropriate given the dyspnea on exertion.  Blood pressure today is excellent.  Return fasting for CBC CMP lipid panel A1c and screen for prostate cancer with a PSA.  Recommended the pneumonia vaccine and a shingles vaccine which he deferred at the present time

## 2024-04-24 ENCOUNTER — Other Ambulatory Visit

## 2024-04-25 ENCOUNTER — Ambulatory Visit: Payer: Self-pay | Admitting: Family Medicine

## 2024-04-25 LAB — LIPID PANEL
Cholesterol: 141 mg/dL (ref <200)
HDL: 49 mg/dL (ref >=40)
LDL Cholesterol (Calc): 76 mg/dL
Non-HDL Cholesterol (Calc): 92 mg/dL (ref <130)
Total CHOL/HDL Ratio: 2.9 (calc) (ref <5.0)
Triglycerides: 81 mg/dL (ref <150)

## 2024-04-25 LAB — COMPREHENSIVE METABOLIC PANEL WITH GFR
AG Ratio: 1.5 (calc) (ref 1.0–2.5)
ALT: 27 U/L (ref 9–46)
AST: 18 U/L (ref 10–35)
Albumin: 4.3 g/dL (ref 3.6–5.1)
Alkaline phosphatase (APISO): 56 U/L (ref 35–144)
BUN: 19 mg/dL (ref 7–25)
CO2: 25 mmol/L (ref 20–32)
Calcium: 9 mg/dL (ref 8.6–10.3)
Chloride: 106 mmol/L (ref 98–110)
Creat: 1.04 mg/dL (ref 0.70–1.35)
Globulin: 2.8 g/dL (ref 1.9–3.7)
Glucose, Bld: 102 mg/dL — ABNORMAL HIGH (ref 65–99)
Potassium: 4.4 mmol/L (ref 3.5–5.3)
Sodium: 140 mmol/L (ref 135–146)
Total Bilirubin: 0.7 mg/dL (ref 0.2–1.2)
Total Protein: 7.1 g/dL (ref 6.1–8.1)
eGFR: 79 mL/min/{1.73_m2} (ref >=60)

## 2024-04-25 LAB — CBC WITH DIFFERENTIAL/PLATELET
Absolute Lymphocytes: 1355 {cells}/uL (ref 850–3900)
Absolute Monocytes: 518 {cells}/uL (ref 200–950)
Basophils Absolute: 59 {cells}/uL (ref 0–200)
Basophils Relative: 1.1 %
Eosinophils Absolute: 119 {cells}/uL (ref 15–500)
Eosinophils Relative: 2.2 %
HCT: 46 % (ref 38.5–50.0)
Hemoglobin: 15.1 g/dL (ref 13.2–17.1)
MCH: 30.9 pg (ref 27.0–33.0)
MCHC: 32.8 g/dL (ref 32.0–36.0)
MCV: 94.1 fL (ref 80.0–100.0)
MPV: 9.8 fL (ref 7.5–12.5)
Monocytes Relative: 9.6 %
Neutro Abs: 3348 {cells}/uL (ref 1500–7800)
Neutrophils Relative %: 62 %
Platelets: 216 10*3/uL (ref 140–400)
RBC: 4.89 10*6/uL (ref 4.20–5.80)
RDW: 12.4 % (ref 11.0–15.0)
Total Lymphocyte: 25.1 %
WBC: 5.4 10*3/uL (ref 3.8–10.8)

## 2024-04-25 LAB — HEMOGLOBIN A1C
Hgb A1c MFr Bld: 6 % — ABNORMAL HIGH (ref <5.7)
Mean Plasma Glucose: 126 mg/dL
eAG (mmol/L): 7 mmol/L

## 2024-04-25 LAB — PSA: PSA: 0.79 ng/mL (ref <=4.00)

## 2024-05-01 ENCOUNTER — Telehealth: Payer: Self-pay | Admitting: Family Medicine

## 2024-05-01 NOTE — Telephone Encounter (Signed)
 Copied from CRM [id number]. Topic: General - Other >> Apr 28, 2024 11:08 AM Stanly Early wrote: Reason for CRM: patient is still waiting for a stress test appt. I informed him about his referral for cardiology. Not sure if that's the same thing but patient would like a callback:[phone]

## 2024-06-21 ENCOUNTER — Other Ambulatory Visit: Payer: Self-pay | Admitting: Family Medicine

## 2024-06-21 DIAGNOSIS — N529 Male erectile dysfunction, unspecified: Secondary | ICD-10-CM

## 2024-06-27 ENCOUNTER — Telehealth: Payer: Self-pay | Admitting: *Deleted

## 2024-06-27 ENCOUNTER — Encounter: Payer: Self-pay | Admitting: *Deleted

## 2024-06-27 NOTE — Telephone Encounter (Signed)
 LMOVM to verify card hx.

## 2024-07-04 ENCOUNTER — Ambulatory Visit: Attending: Cardiovascular Disease | Admitting: Cardiovascular Disease

## 2024-07-04 ENCOUNTER — Encounter: Payer: Self-pay | Admitting: Cardiovascular Disease

## 2024-07-04 VITALS — BP 122/72 | HR 63 | Ht 70.0 in | Wt 193.4 lb

## 2024-07-04 DIAGNOSIS — R072 Precordial pain: Secondary | ICD-10-CM | POA: Diagnosis not present

## 2024-07-04 DIAGNOSIS — R0602 Shortness of breath: Secondary | ICD-10-CM | POA: Diagnosis not present

## 2024-07-04 MED ORDER — METOPROLOL TARTRATE 50 MG PO TABS: ORAL_TABLET | ORAL | 0 refills | Status: AC

## 2024-07-04 NOTE — Progress Notes (Signed)
 Cardiology Office Note   Date:  07/04/2024   ID:  Erik [name], DOB [birth year], MRN [id number]  PCP:  Erik Butler DASEN, MD  Cardiologist:   [name], MD   Chief Complaint  Patient presents with   New Patient (Initial Visit)    SOB c/o occasional chest discomfort. Meds reviewed verbally with pt.      History of Present Illness: Erik [name] is a 68 y.o. male who was referred by Dr. Duanne for evaluation of chest pain and exertional dyspnea.  He has known history of prediabetes.   He was seen by me in 2019 for atypical chest pain.  Cardiac workup was unremarkable including nuclear stress test and an echocardiogram.   He is not a smoker and there is no family history of premature coronary artery disease.    He reports intermittent episodes of substernal chest pain described as dull sensation lasting about 10 minutes.  This can happen at rest or with exertion.  He has no reflux symptoms.  He does complain of exertional dyspnea.  No recent cardiac evaluation. .    Past Medical History:  Diagnosis Date   Prediabetes    Sleep apnea     Past Surgical History:  Procedure Laterality Date   HERNIA REPAIR Right    inguinal   TONSILLECTOMY       Current Outpatient Medications  Medication Sig Dispense Refill   fluticasone (FLONASE) 50 MCG/ACT nasal spray Place 2 sprays into both nostrils daily. (Patient taking differently: Place 2 sprays into both nostrils as needed.)     Multiple Vitamin (MULTIVITAMIN) tablet Take 1 tablet by mouth daily.     naproxen  sodium (ALEVE ) 220 MG tablet Take 220 mg by mouth daily as needed.      sildenafil  (VIAGRA ) 50 MG tablet TAKE 1 TABLET BY MOUTH AS NEEDED FOR ERECTILE DYSFUNCTION 15 tablet 1   solifenacin  (VESICARE ) 10 MG tablet Take 1 tablet (10 mg total) by mouth daily. 30 tablet 3   Melatonin 10 MG TABS Take by mouth. (Patient not taking: Reported on 07/04/2024)     Omega-3 Fatty Acids (FISH OIL OMEGA-3 PO) Take by mouth. (Patient not  taking: Reported on 07/04/2024)     No current facility-administered medications for this visit.    Allergies:   Propofol    Social History:  The patient  reports that he has never smoked. He has never used smokeless tobacco. He reports current alcohol use. He reports that he does not use drugs.   Family History:  The patient's family history includes Breast cancer in his mother and sister; Cancer in his father, mother, and sister; Heart attack in his sister; Hypertension in his brother and sister; Supraventricular tachycardia in his sister.    ROS:  Please see the history of present illness.   Otherwise, review of systems are positive for none.   All other systems are reviewed and negative.    PHYSICAL EXAM: VS:  BP 122/72 (BP Location: Right Arm, Patient Position: Sitting, Cuff Size: Normal)   Pulse 63   Ht 5' 10 (1.778 m)   Wt 193 lb 6 oz (87.7 kg)   SpO2 96%   BMI 27.75 kg/m  , BMI Body mass index is 27.75 kg/m. GEN: Well nourished, well developed, in no acute distress  HEENT: normal  Neck: no JVD, carotid bruits, or masses Cardiac: RRR; no murmurs, rubs, or gallops,no edema  Respiratory:  clear to auscultation bilaterally, normal work of breathing GI:  soft, nontender, nondistended, + BS MS: no deformity or atrophy  Skin: warm and dry, no rash Neuro:  Strength and sensation are intact Psych: euthymic mood, full affect   EKG:  EKG is ordered today. The ekg ordered today demonstrates: Normal sinus rhythm Normal ECG When compared with ECG of 10-Jul-2019 10:28, Questionable change in QRS axis Inverted T waves have replaced nonspecific T wave abnormality in Inferior leads    Recent Labs: 09/22/2023: TSH 3.040 04/24/2024: ALT 27; BUN 19; Creat 1.04; Hemoglobin 15.1; Platelets 216; Potassium 4.4; Sodium 140    Lipid Panel    Component Value Date/Time   CHOL 141 04/24/2024 0829   CHOL 146 09/22/2023 0830   TRIG 81 04/24/2024 0829   HDL 49 04/24/2024 0829   HDL 48  09/22/2023 0830   CHOLHDL 2.9 04/24/2024 0829   LDLCALC 76 04/24/2024 0829      Wt Readings from Last 3 Encounters:  07/04/24 193 lb 6 oz (87.7 kg)  04/20/24 193 lb (87.5 kg)  09/20/23 195 lb (88.5 kg)          07/04/2024    1:44 PM 01/25/2018    3:47 PM  PAD Screen  Previous PAD dx? No No  Previous surgical procedure? No No  Pain with walking? No No  Feet/toe relief with dangling? No No  Painful, non-healing ulcers? No No  Extremities discolored? No No      ASSESSMENT AND PLAN:  1.  Atypical chest pain: He is prediabetic with abnormal baseline EKG showing inferior T wave changes.  I recommend evaluation with a cardiac CTA.  2.  Exertional dyspnea: Worrisome for angina equivalent.  Previous echocardiogram 2019 showed normal ejection fraction and no significant valvular abnormalities.  His cardiac exam today is unremarkable.   Disposition:   FU with me as needed if cardiac CTA is abnormal.  Signed,  [name], MD  07/04/2024 1:57 PM    [facility]

## 2024-07-04 NOTE — Patient Instructions (Signed)
 Medication Instructions:  No changes *If you need a refill on your cardiac medications before your next appointment, please call your pharmacy*  Lab Work: Your provider would like for you to have the following labs today: BMET  If you have labs (blood work) drawn today and your tests are completely normal, you will receive your results only by: MyChart Message (if you have MyChart) OR A paper copy in the mail If you have any lab test that is abnormal or we need to change your treatment, we will call you to review the results.  Follow-Up: At [facility], you and your health needs are our priority.  As part of our continuing mission to provide you with exceptional heart care, our providers are all part of one team.  This team includes your primary Cardiologist (physician) and Advanced Practice Providers or APPs (Physician Assistants and Nurse Practitioners) who all work together to provide you with the care you need, when you need it.  Your next appointment:   Follow up as needed based on the results  We recommend signing up for the patient portal called MyChart.  Sign up information is provided on this After Visit Summary.  MyChart is used to connect with patients for Virtual Visits (Telemedicine).  Patients are able to view lab/test results, encounter notes, upcoming appointments, etc.  Non-urgent messages can be sent to your provider as well.   To learn more about what you can do with MyChart, go to [email or web address].   Other Instructions   Your cardiac CT will be scheduled at one of the below locations:    [facility] [address] [place], [place] [address] [phone]  If scheduled at [facility], please arrive to the Heart and Vascular Center 15 mins early for check-in and test prep.  There is spacious parking and easy access to the radiology department from the [facility] V[facility] entrance. Please enter here  and check-in with the desk attendant.   Please follow these instructions carefully (unless otherwise directed):  An IV will be required for this test and Nitroglycerin will be given.  Hold all erectile dysfunction medications at least 3 days (72 hrs) prior to test. (Ie viagra , cialis, sildenafil , tadalafil, etc)   On the Night Before the Test: Be sure to Drink plenty of water. Do not consume any caffeinated/decaffeinated beverages or chocolate 12 hours prior to your test. Do not take any antihistamines 12 hours prior to your test. If the patient has contrast allergy: Patient will need a prescription for Prednisone and very clear instructions (as follows): Prednisone 50 mg - take 13 hours prior to test Take another Prednisone 50 mg 7 hours prior to test Take another Prednisone 50 mg 1 hour prior to test Take Benadryl 50 mg 1 hour prior to test Patient must complete all four doses of above prophylactic medications. Patient will need a ride after test due to Benadryl.  On the Day of the Test: Drink plenty of water until 1 hour prior to the test. Do not eat any food 1 hour prior to test. You may take your regular medications prior to the test.  Take metoprolol  (Lopressor ) two hours prior to test. If you take Furosemide/Hydrochlorothiazide/Spironolactone/Chlorthalidone, please HOLD on the morning of the test. Patients who wear a continuous glucose monitor MUST remove the device prior to scanning.      After the Test: Drink plenty of water. After receiving IV contrast, you may experience a mild flushed  feeling. This is normal. On occasion, you may experience a mild rash up to 24 hours after the test. This is not dangerous. If this occurs, you can take Benadryl 25 mg, Zyrtec, Claritin, or Allegra and increase your fluid intake. (Patients taking Tikosyn should avoid Benadryl, and may take Zyrtec, Claritin, or Allegra) If you experience trouble breathing, this can be serious. If it is severe  call 911 IMMEDIATELY. If it is mild, please call our office.  We will call to schedule your test 2-4 weeks out understanding that some insurance companies will need an authorization prior to the service being performed.   For more information and frequently asked questions, please visit our website : [email or web address]  For non-scheduling related questions, please contact the cardiac imaging nurse navigator should you have any questions/concerns: Cardiac Imaging Nurse Navigators Direct Office Dial: [phone]   For scheduling needs, including cancellations and rescheduling, please call [place], [phone].

## 2024-07-05 ENCOUNTER — Ambulatory Visit: Payer: Self-pay | Admitting: *Deleted

## 2024-07-05 LAB — BASIC METABOLIC PANEL WITH GFR
BUN/Creatinine Ratio: 26 — ABNORMAL HIGH (ref 10–24)
BUN: 30 mg/dL — ABNORMAL HIGH (ref 8–27)
CO2: 20 mmol/L (ref 20–29)
Calcium: 9.4 mg/dL (ref 8.6–10.2)
Chloride: 106 mmol/L (ref 96–106)
Creatinine, Ser: 1.17 mg/dL (ref 0.76–1.27)
Glucose: 92 mg/dL (ref 70–99)
Potassium: 4.3 mmol/L (ref 3.5–5.2)
Sodium: 140 mmol/L (ref 134–144)
eGFR: 69 mL/min/1.73 (ref >59)

## 2024-07-11 ENCOUNTER — Encounter (HOSPITAL_COMMUNITY): Payer: Self-pay

## 2024-07-11 ENCOUNTER — Telehealth: Payer: Self-pay | Admitting: Cardiovascular Disease

## 2024-07-11 NOTE — Telephone Encounter (Signed)
 Pt was told he should take a medication two hours prior to the CT scan to slow his hear rate down but isn't aware of the medication. Pt asking to leave a detailed message due to him not being able to answer at work.

## 2024-07-11 NOTE — Telephone Encounter (Signed)
 Left detailed message informing patient he will take metoprolol  tartrate 50 mg once, 2 hours prior to CT scan scheduled on 07/13/24.  Also informed him that he should not take sildenafil  3 days prior to his CT scan.   Will also send MyChart message.

## 2024-07-13 ENCOUNTER — Ambulatory Visit
Admission: RE | Admit: 2024-07-13 | Discharge: 2024-07-13 | Disposition: A | Discharge status: Home / Self Care | Source: Ambulatory Visit | Attending: Cardiovascular Disease | Admitting: Cardiovascular Disease

## 2024-07-13 DIAGNOSIS — R072 Precordial pain: Secondary | ICD-10-CM | POA: Diagnosis present

## 2024-07-13 MED ORDER — IOHEXOL 350 MG/ML SOLN
100.0000 mL | Freq: Once | INTRAVENOUS | Status: AC | PRN
  Administered 2024-07-13: 100 mL via INTRAVENOUS

## 2024-07-13 MED ORDER — NITROGLYCERIN 0.4 MG SL SUBL
0.8000 mg | SUBLINGUAL_TABLET | Freq: Once | SUBLINGUAL | Status: AC
  Administered 2024-07-13: 0.8 mg via SUBLINGUAL
  Filled 2024-07-13: qty 25

## 2024-07-13 NOTE — Progress Notes (Signed)
 Patient tolerated CT well. Vital signs stable encourage to drink water throughout day.Reasons explained and verbalized understanding. Ambulated steady gait.

## 2024-07-13 NOTE — Telephone Encounter (Signed)
My chart message has been read

## 2024-08-27 ENCOUNTER — Other Ambulatory Visit: Payer: Self-pay | Admitting: Family Medicine

## 2024-08-27 DIAGNOSIS — N529 Male erectile dysfunction, unspecified: Secondary | ICD-10-CM

## 2024-08-28 NOTE — Telephone Encounter (Signed)
 Erik [name]

## 2024-09-20 ENCOUNTER — Other Ambulatory Visit

## 2024-09-20 DIAGNOSIS — Z0000 Encounter for general adult medical examination without abnormal findings: Secondary | ICD-10-CM

## 2024-09-20 DIAGNOSIS — R7303 Prediabetes: Secondary | ICD-10-CM

## 2024-09-20 DIAGNOSIS — G479 Sleep disorder, unspecified: Secondary | ICD-10-CM

## 2024-09-20 DIAGNOSIS — G4733 Obstructive sleep apnea (adult) (pediatric): Secondary | ICD-10-CM

## 2024-09-20 DIAGNOSIS — K51219 Ulcerative (chronic) proctitis with unspecified complications: Secondary | ICD-10-CM

## 2024-09-20 DIAGNOSIS — Z125 Encounter for screening for malignant neoplasm of prostate: Secondary | ICD-10-CM

## 2024-09-20 DIAGNOSIS — R7989 Other specified abnormal findings of blood chemistry: Secondary | ICD-10-CM

## 2024-09-20 DIAGNOSIS — Z13220 Encounter for screening for lipoid disorders: Secondary | ICD-10-CM

## 2024-09-20 DIAGNOSIS — R5382 Chronic fatigue, unspecified: Secondary | ICD-10-CM

## 2024-09-21 ENCOUNTER — Ambulatory Visit: Payer: Self-pay | Admitting: Family Medicine

## 2024-09-21 LAB — COMPLETE METABOLIC PANEL WITHOUT GFR
AG Ratio: 1.7 (calc) (ref 1.0–2.5)
ALT: 26 U/L (ref 9–46)
AST: 22 U/L (ref 10–35)
Albumin: 4.5 g/dL (ref 3.6–5.1)
Alkaline phosphatase (APISO): 58 U/L (ref 35–144)
BUN: 22 mg/dL (ref 7–25)
CO2: 30 mmol/L (ref 20–32)
Calcium: 9.1 mg/dL (ref 8.6–10.3)
Chloride: 104 mmol/L (ref 98–110)
Creat: 1.14 mg/dL (ref 0.70–1.35)
Globulin: 2.7 g/dL (ref 1.9–3.7)
Glucose, Bld: 101 mg/dL — ABNORMAL HIGH (ref 65–99)
Potassium: 4.1 mmol/L (ref 3.5–5.3)
Sodium: 140 mmol/L (ref 135–146)
Total Bilirubin: 0.6 mg/dL (ref 0.2–1.2)
Total Protein: 7.2 g/dL (ref 6.1–8.1)

## 2024-09-21 LAB — CBC WITH DIFFERENTIAL/PLATELET
Absolute Lymphocytes: 1495 {cells}/uL (ref 850–3900)
Absolute Monocytes: 673 {cells}/uL (ref 200–950)
Basophils Absolute: 89 {cells}/uL (ref 0–200)
Basophils Relative: 1.2 %
Eosinophils Absolute: 118 {cells}/uL (ref 15–500)
Eosinophils Relative: 1.6 %
HCT: 44.7 % (ref 38.5–50.0)
Hemoglobin: 14.7 g/dL (ref 13.2–17.1)
MCH: 30.6 pg (ref 27.0–33.0)
MCHC: 32.9 g/dL (ref 32.0–36.0)
MCV: 92.9 fL (ref 80.0–100.0)
MPV: 9.9 fL (ref 7.5–12.5)
Monocytes Relative: 9.1 %
Neutro Abs: 5025 {cells}/uL (ref 1500–7800)
Neutrophils Relative %: 67.9 %
Platelets: 223 Thousand/uL (ref 140–400)
RBC: 4.81 Million/uL (ref 4.20–5.80)
RDW: 12.1 % (ref 11.0–15.0)
Total Lymphocyte: 20.2 %
WBC: 7.4 Thousand/uL (ref 3.8–10.8)

## 2024-09-21 LAB — TESTOSTERONE: Testosterone: 348 ng/dL (ref 250–827)

## 2024-09-21 LAB — LIPID PANEL
Cholesterol: 142 mg/dL (ref <200)
HDL: 51 mg/dL (ref >=40)
LDL Cholesterol (Calc): 74 mg/dL
Non-HDL Cholesterol (Calc): 91 mg/dL (ref <130)
Total CHOL/HDL Ratio: 2.8 (calc) (ref <5.0)
Triglycerides: 91 mg/dL (ref <150)

## 2024-09-21 LAB — HEMOGLOBIN A1C
Hgb A1c MFr Bld: 5.8 % — ABNORMAL HIGH (ref <5.7)
Mean Plasma Glucose: 120 mg/dL
eAG (mmol/L): 6.6 mmol/L

## 2024-09-25 ENCOUNTER — Encounter: Payer: Self-pay | Admitting: Family Medicine

## 2024-09-25 ENCOUNTER — Ambulatory Visit (INDEPENDENT_AMBULATORY_CARE_PROVIDER_SITE_OTHER): Admitting: Family Medicine

## 2024-09-25 VITALS — BP 128/74 | HR 95 | Temp 98.0°F | Ht 70.0 in | Wt 196.2 lb

## 2024-09-25 DIAGNOSIS — R7303 Prediabetes: Secondary | ICD-10-CM | POA: Diagnosis not present

## 2024-09-25 DIAGNOSIS — Z0001 Encounter for general adult medical examination with abnormal findings: Secondary | ICD-10-CM

## 2024-09-25 DIAGNOSIS — Z23 Encounter for immunization: Secondary | ICD-10-CM | POA: Diagnosis not present

## 2024-09-25 DIAGNOSIS — Z0000 Encounter for general adult medical examination without abnormal findings: Secondary | ICD-10-CM

## 2024-09-25 NOTE — Addendum Note (Signed)
 Addended by: ANGELENA RONAL BRADLEY K on: 09/25/2024 12:32 PM   Modules accepted: Orders

## 2024-09-25 NOTE — Progress Notes (Signed)
 Subjective:     Patient ID: Erik [name], male   DOB: 12/25/1955, 68 y.o.   MRN: [id number]   Patient is a very pleasant 68 year old Caucasian gentleman here today for complete physical exam.  He had his colonoscopy earlier this year.  He states that they gave him a 5-year pass on his colonoscopy.  We screen for prostate cancer with a PSA in June of this year which was normal.  He does have a history of prediabetes.  His most recent lab work showed a hemoglobin A1c of 5.8.  Otherwise his lab work was outstanding.  Due to atypical chest pain, we had the patient see cardiology.  They performed a coronary artery calcium score which was 0!  Otherwise he has been doing well.  Physical exam is significant for an umbilical hernia which she is asymptomatic from.  He also has tenderness over his right greater trochanteric bursa.  Otherwise he is doing well.  He is due for a flu shot, shingles vaccine, and the pneumonia vaccine.  He elects to receive the pneumonia vaccine today  Past Medical History:  Diagnosis Date   Prediabetes    Sleep apnea    Reviewing old records shows remote history of sleep apnea.  Past Surgical History:  Procedure Laterality Date   HERNIA REPAIR Right    inguinal   TONSILLECTOMY     Current Outpatient Medications on File Prior to Visit  Medication Sig Dispense Refill   fluticasone (FLONASE) 50 MCG/ACT nasal spray Place 2 sprays into both nostrils daily. (Patient taking differently: Place 2 sprays into both nostrils as needed.)     metoprolol  tartrate (LOPRESSOR ) 50 MG tablet Take one tablet two hours prior to the test 1 tablet 0   Multiple Vitamin (MULTIVITAMIN) tablet Take 1 tablet by mouth daily.     naproxen  sodium (ALEVE ) 220 MG tablet Take 220 mg by mouth daily as needed.      sildenafil  (VIAGRA ) 50 MG tablet TAKE 1 TABLET BY MOUTH AS NEEDED FOR ERECTILE DYSFUNCTION 15 tablet 1   Melatonin 10 MG TABS Take by mouth. (Patient not taking: Reported on 09/25/2024)      Omega-3 Fatty Acids (FISH OIL OMEGA-3 PO) Take by mouth. (Patient not taking: Reported on 09/25/2024)     solifenacin  (VESICARE ) 10 MG tablet TAKE 1 TABLET BY MOUTH EVERY DAY (Patient not taking: Reported on 09/25/2024) 30 tablet 3   No current facility-administered medications on file prior to visit.   No Active Allergies  Social History   Socioeconomic History   Marital status: Married    Spouse name: Not on file   Number of children: Not on file   Years of education: Not on file   Highest education level: Associate degree: occupational, scientist, product/process development, or vocational program  Occupational History   Not on file  Tobacco Use   Smoking status: Never   Smokeless tobacco: Never  Vaping Use   Vaping status: Never Used  Substance and Sexual Activity   Alcohol use: Yes    Comment: occ   Drug use: Never   Sexual activity: Not on file  Other Topics Concern   Not on file  Social History Narrative   Not on file   Social Drivers of Health   Financial Resource Strain: Low Risk  (09/24/2024)   Overall Financial Resource Strain (CARDIA)    Difficulty of Paying Living Expenses: Not hard at all  Food Insecurity: No Food Insecurity (09/24/2024)   Hunger Vital Sign  Worried About Programme Researcher, Broadcasting/film/video in the Last Year: Never true    Ran Out of Food in the Last Year: Never true  Transportation Needs: No Transportation Needs (09/24/2024)   PRAPARE - Administrator, Civil Service (Medical): No    Lack of Transportation (Non-Medical): No  Physical Activity: Inactive (09/24/2024)   Exercise Vital Sign    Days of Exercise per Week: 0 days    Minutes of Exercise per Session: Not on file  Stress: Stress Concern Present (09/24/2024)   [employer] of Occupational Health - Occupational Stress Questionnaire    Feeling of Stress: To some extent  Social Connections: Moderately Isolated (09/24/2024)   Social Connection and Isolation Panel    Frequency of Communication with Friends and  Family: Twice a week    Frequency of Social Gatherings with Friends and Family: Never    Attends Religious Services: More than 4 times per year    Active Member of [employer] or Organizations: No    Attends Engineer, Structural: Not on file    Marital Status: Married  Intimate Partner Violence: Unknown (07/22/2023)   Received from [facility]   HITS    Physically Hurt: Not on file    Insult or Talk Down To: Not on file    Threaten Physical Harm: Not on file    Scream or Curse: Not on file   Family History  Problem Relation Age of Onset   Cancer Mother        Breast   Breast cancer Mother    Cancer Father        Esophagus   Heart attack Sister    Cancer Sister    Hypertension Sister    Supraventricular tachycardia Sister    Breast cancer Sister    Hypertension Brother       Review of Systems  All other systems reviewed and are negative.      Objective:   Physical Exam Vitals reviewed.  Constitutional:      General: He is not in acute distress.    Appearance: Normal appearance. He is normal weight. He is not ill-appearing, toxic-appearing or diaphoretic.  HENT:     Head: Normocephalic and atraumatic.     Nose: Nose normal.     Mouth/Throat:     Pharynx: No oropharyngeal exudate or posterior oropharyngeal erythema.  Eyes:     Extraocular Movements: Extraocular movements intact.     Conjunctiva/sclera: Conjunctivae normal.     Pupils: Pupils are equal, round, and reactive to light.  Neck:     Vascular: No carotid bruit.  Cardiovascular:     Rate and Rhythm: Normal rate and regular rhythm.     Pulses: Normal pulses.     Heart sounds: Normal heart sounds. No murmur heard.    No friction rub. No gallop.  Pulmonary:     Effort: Pulmonary effort is normal. No respiratory distress.     Breath sounds: Normal breath sounds. No wheezing, rhonchi or rales.  Abdominal:     General: Abdomen is flat. Bowel sounds are normal. There is no distension.     Palpations:  Abdomen is soft.     Tenderness: There is no abdominal tenderness. There is no guarding or rebound.     Hernia: A hernia is present.   Musculoskeletal:     Right hip: Tenderness and bony tenderness present.     Right lower leg: No edema.     Left lower leg: No edema.  Legs:  Lymphadenopathy:     Cervical: No cervical adenopathy.  Skin:    Findings: No erythema or rash.  Neurological:     General: No focal deficit present.     Mental Status: He is alert and oriented to person, place, and time. Mental status is at baseline.     Cranial Nerves: No cranial nerve deficit.     Motor: No weakness.     Coordination: Coordination normal.     Gait: Gait normal.     Deep Tendon Reflexes: Reflexes normal.  Psychiatric:        Mood and Affect: Mood normal.        Behavior: Behavior normal.        Thought Content: Thought content normal.        Judgment: Judgment normal.       Assessment:     General medical exam  Pre-diabetes      Plan:     Physical exam today is significant for greater trochanteric bursitis as well as an umbilical hernia.  Patient declines a cortisone injection today.  We discussed a referral to general surgery however he defers that at the present time.  Immunizations are due.  The patient is due for Prevnar 20, Shingrix, and a flu shot.  He elects to receive Prevnar 20 today but declines the others.  PSA and colonoscopy are up-to-date.  Colonoscopy is due again in 2030.  Lab work is included below and was outstanding aside from mild prediabetes.  Coronary artery calcium score was 0. Lab on 09/20/2024  Component Date Value Ref Range Status   WBC 09/20/2024 7.4  3.8 - 10.8 Thousand/uL Final   RBC 09/20/2024 4.81  4.20 - 5.80 Million/uL Final   Hemoglobin 09/20/2024 14.7  13.2 - 17.1 g/dL Final   HCT 88/94/7974 44.7  38.5 - 50.0 % Final   MCV 09/20/2024 92.9  80.0 - 100.0 fL Final   MCH 09/20/2024 30.6  27.0 - 33.0 pg Final   MCHC 09/20/2024 32.9  32.0 - 36.0  g/dL Final   Comment: For adults, a slight decrease in the calculated MCHC value (in the range of 30 to 32 g/dL) is most likely not clinically significant; however, it should be interpreted with caution in correlation with other red cell parameters and the patient's clinical condition.    RDW 09/20/2024 12.1  11.0 - 15.0 % Final   Platelets 09/20/2024 223  140 - 400 Thousand/uL Final   MPV 09/20/2024 9.9  7.5 - 12.5 fL Final   Neutro Abs 09/20/2024 5,025  1,500 - 7,800 cells/uL Final   Absolute Lymphocytes 09/20/2024 1,495  850 - 3,900 cells/uL Final   Absolute Monocytes 09/20/2024 673  200 - 950 cells/uL Final   Eosinophils Absolute 09/20/2024 118  15 - 500 cells/uL Final   Basophils Absolute 09/20/2024 89  0 - 200 cells/uL Final   Neutrophils Relative % 09/20/2024 67.9  % Final   Total Lymphocyte 09/20/2024 20.2  % Final   Monocytes Relative 09/20/2024 9.1  % Final   Eosinophils Relative 09/20/2024 1.6  % Final   Basophils Relative 09/20/2024 1.2  % Final   Glucose, Bld 09/20/2024 101 (H)  65 - 99 mg/dL Final   Comment: .            Fasting reference interval . For someone without known diabetes, a glucose value between 100 and 125 mg/dL is consistent with prediabetes and should be confirmed with a follow-up test. .  BUN 09/20/2024 22  7 - 25 mg/dL Final   Creat 88/94/7974 1.14  0.70 - 1.35 mg/dL Final   BUN/Creatinine Ratio 09/20/2024 SEE NOTE:  6 - 22 (calc) Final   Comment:    Not Reported: BUN and Creatinine are within    reference range. .    Sodium 09/20/2024 140  135 - 146 mmol/L Final   Potassium 09/20/2024 4.1  3.5 - 5.3 mmol/L Final   Chloride 09/20/2024 104  98 - 110 mmol/L Final   CO2 09/20/2024 30  20 - 32 mmol/L Final   Calcium 09/20/2024 9.1  8.6 - 10.3 mg/dL Final   Total Protein 88/94/7974 7.2  6.1 - 8.1 g/dL Final   Albumin 88/94/7974 4.5  3.6 - 5.1 g/dL Final   Globulin 88/94/7974 2.7  1.9 - 3.7 g/dL (calc) Final   AG Ratio 09/20/2024 1.7  1.0 -  2.5 (calc) Final   Total Bilirubin 09/20/2024 0.6  0.2 - 1.2 mg/dL Final   Alkaline phosphatase (APISO) 09/20/2024 58  35 - 144 U/L Final   AST 09/20/2024 22  10 - 35 U/L Final   ALT 09/20/2024 26  9 - 46 U/L Final   Hgb A1c MFr Bld 09/20/2024 5.8 (H)  <5.7 % Final   Comment: For someone without known diabetes, a hemoglobin  A1c value between 5.7% and 6.4% is consistent with prediabetes and should be confirmed with a  follow-up test. . For someone with known diabetes, a value <7% indicates that their diabetes is well controlled. A1c targets should be individualized based on duration of diabetes, age, comorbid conditions, and other considerations. . This assay result is consistent with an increased risk of diabetes. . Currently, no consensus exists regarding use of hemoglobin A1c for diagnosis of diabetes for children. .    Mean Plasma Glucose 09/20/2024 120  mg/dL Final   eAG (mmol/L) 88/94/7974 6.6  mmol/L Final   Cholesterol 09/20/2024 142  <200 mg/dL Final   HDL 88/94/7974 51  > OR = 40 mg/dL Final   Triglycerides 88/94/7974 91  <150 mg/dL Final   LDL Cholesterol (Calc) 09/20/2024 74  mg/dL (calc) Final   Comment: Reference range: <100 . Desirable range <100 mg/dL for primary prevention;   <70 mg/dL for patients with CHD or diabetic patients  with > or = 2 CHD risk factors. SABRA LDL-C is now calculated using the Martin-Hopkins  calculation, which is a validated novel method providing  better accuracy than the Friedewald equation in the  estimation of LDL-C.  Gladis APPLETHWAITE et al. SANDREA. 7986;689(80): 2061-2068  (http://education.[facility].com/faq/FAQ164)    Total CHOL/HDL Ratio 09/20/2024 2.8  <4.9 (calc) Final   Non-HDL Cholesterol (Calc) 09/20/2024 91  <130 mg/dL (calc) Final   Comment: For patients with diabetes plus 1 major ASCVD risk  factor, treating to a non-HDL-C goal of <100 mg/dL  (LDL-C of <29 mg/dL) is considered a therapeutic  option.    Testosterone   09/20/2024 348  250 - 827 ng/dL Final

## 2024-12-22 ENCOUNTER — Ambulatory Visit: Payer: Self-pay

## 2024-12-22 NOTE — Telephone Encounter (Signed)
 FYI Only or Action Required?: Action required by provider: referral request.  Patient was last seen in primary care on 09/25/2024 by Erik [name] DASEN, MD.  Called Nurse Triage reporting [place].  Symptoms began several weeks ago.  Interventions attempted: Nothing.  Symptoms are: gradually worsening.  Triage Disposition: See PCP Within 2 Weeks  Patient/caregiver understands and will follow disposition?: Yes    Message from Berwyn MATSU sent at 12/22/2024  3:10 PM EST  Reason for Triage: right side of head has dime size ball on his head that is scaly and is causing concerns it is growing its been going on for 3 weeks its worsening and not going away.      Reason for Disposition  [1] Skin growth or mole AND [2] sticks up out of the skin (elevated) AND [3] feels rough to the touch  Answer Assessment - Initial Assessment Questions Pt called requesting referral to dermatology for new and growing skin lestion to R side of head and L cheek. Pt states site on his head is scaly and elevated, no pain, no itching but growing in size. Pt also report red patch on his L cheek with hx of sun exposure to L side. Pt reports lesions have been present x 3 weeks. Pt had previously discussed topical chemo cream with PCP but would prefer referral at this time.     1. APPEARANCE of LESION: What does it look like?      R side of head scaly, red lesion size of a dime      L cheek, red patch, no scaling   2. SIZE: How big is it? (e.g., inches, cm; or compare to size of pinhead, tip of pen, eraser, coin, pea, grape, ping pong ball)      Size of a time   3. COLOR: What color is it? Is there more than one color?     Red   5. RAISED: Does it stick up above the skin or is it flat? (e.g., raised or elevated)     Raised   6. TENDER: Does it hurt when you touch it?  (Scale 1-10; or mild, moderate, severe)     No   7. LOCATION: Where is it located?      R side head and L cheek   8. ONSET:  When did it first appear?      X 3 weeks ago   9. NUMBER: Is there just one? or Are there others?     2 total   10. CAUSE: What do you think it is?       Suspects skin cancer vs. Skin cancer after appt with PCP   11. OTHER SYMPTOMS: Do you have any other symptoms? (e.g., fever)       Erik [name]  Protocols used: Skin Lesion - Moles or Growths-A-AH

## 2024-12-22 NOTE — Telephone Encounter (Signed)
 Called pt. To inquire more information and to schedule . No answer. Lvm for more information.
